# Patient Record
Sex: Female | Born: 1995 | Hispanic: Yes | Marital: Married | Smoking: Never smoker
Health system: Southern US, Community
[De-identification: ages and names within clinical notes are randomized; demographics above are authoritative.]

## PROBLEM LIST (undated history)

## (undated) ENCOUNTER — Inpatient Hospital Stay: Payer: Self-pay

## (undated) DIAGNOSIS — E559 Vitamin D deficiency, unspecified: Secondary | ICD-10-CM

## (undated) DIAGNOSIS — J45909 Unspecified asthma, uncomplicated: Secondary | ICD-10-CM

## (undated) HISTORY — PX: NO PAST SURGERIES: SHX2092

---
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
## 2017-10-23 NOTE — L&D Delivery Note (Signed)
Delivery Note  Date of delivery: 09/29/2018 Estimated Date of Delivery: 09/30/18 Patient's last menstrual period was 01/07/2018 (exact date). EGA: [id number]  First Stage: Labor onset: 09/28/2018 2200 Augmentation : AROM Analgesia Eliezer Lofts/Anesthesia intrapartum: IV pain medicine, epidural AROM at 1122 09/29/2018  Carloyn JaegerKelly Torres-Rivera presented to L&D with contractions. She was initially expectantly managed and later augmented with AROM. IV Stadol given and later epidural placed for pain relief.   Second Stage: Complete dilation at 1458 Onset of pushing at 1507 FHR second stage: category II Delivery at 1539 on 09/29/2018  She progressed to complete and had a spontaneous vaginal birth of a live female over an intact perineum. The fetal head was delivered in direct OA position with restitution to LOA. One loop of loose nuchal cord, reduced on the perineum. Anterior then posterior shoulders delivered spontaneously. Baby placed on mom's abdomen and attended to by transition RN. Cord clamped and cut after two minute delay by father of the baby.   Third Stage: Placenta delivered spontaneously intact with 3VC at 1554 Placenta disposition: routine disposal Uterine tone firm / bleeding scant IV pitocin given for hemorrhage prophylaxis  Left labial laceration identified, hemostatic Anesthesia for repair: n/a Repair: n/a Est. Blood Loss (mL): 400  Complications: none  Mom to postpartum.  Baby to Couplet care / Skin to Skin.  Newborn: Birth Weight: pending  Apgar Scores: 8, 9 Feeding planned: breast   Genia DelMargaret Holman Bonsignore, CNM 09/29/2018 4:05 PM

## 2018-01-27 ENCOUNTER — Encounter: Payer: Self-pay | Admitting: Emergency Medicine

## 2018-01-27 ENCOUNTER — Emergency Department
Admission: EM | Admit: 2018-01-27 | Discharge: 2018-01-27 | Disposition: A | Discharge status: Home / Self Care | Payer: Medicaid Other | Attending: Emergency Medicine | Admitting: Emergency Medicine

## 2018-01-27 DIAGNOSIS — Z3491 Encounter for supervision of normal pregnancy, unspecified, first trimester: Secondary | ICD-10-CM | POA: Insufficient documentation

## 2018-01-27 DIAGNOSIS — Z3A01 Less than 8 weeks gestation of pregnancy: Secondary | ICD-10-CM | POA: Diagnosis not present

## 2018-01-27 DIAGNOSIS — O99511 Diseases of the respiratory system complicating pregnancy, first trimester: Secondary | ICD-10-CM | POA: Diagnosis not present

## 2018-01-27 DIAGNOSIS — R0602 Shortness of breath: Secondary | ICD-10-CM | POA: Diagnosis not present

## 2018-01-27 DIAGNOSIS — R002 Palpitations: Secondary | ICD-10-CM | POA: Diagnosis not present

## 2018-01-27 DIAGNOSIS — O99411 Diseases of the circulatory system complicating pregnancy, first trimester: Secondary | ICD-10-CM | POA: Insufficient documentation

## 2018-01-27 DIAGNOSIS — Z79899 Other long term (current) drug therapy: Secondary | ICD-10-CM | POA: Insufficient documentation

## 2018-01-27 DIAGNOSIS — O24111 Pre-existing diabetes mellitus, type 2, in pregnancy, first trimester: Secondary | ICD-10-CM | POA: Insufficient documentation

## 2018-01-27 LAB — CBC
HCT: 41 % (ref 35.0–47.0)
Hemoglobin: 13.7 g/dL (ref 12.0–16.0)
MCH: 28.5 pg (ref 26.0–34.0)
MCHC: 33.4 g/dL (ref 32.0–36.0)
MCV: 85.3 fL (ref 80.0–100.0)
Platelets: 235 K/uL (ref 150–440)
RBC: 4.81 MIL/uL (ref 3.80–5.20)
RDW: 14.7 % — ABNORMAL HIGH (ref 11.5–14.5)
WBC: 10 K/uL (ref 3.6–11.0)

## 2018-01-27 LAB — BASIC METABOLIC PANEL WITH GFR
Anion gap: 8 (ref 5–15)
BUN: 11 mg/dL (ref 6–20)
CO2: 22 mmol/L (ref 22–32)
Calcium: 8.9 mg/dL (ref 8.9–10.3)
Chloride: 108 mmol/L (ref 101–111)
Creatinine, Ser: 0.64 mg/dL (ref 0.44–1.00)
GFR calc Af Amer: >60 mL/min
GFR calc non Af Amer: >60 mL/min
Glucose, Bld: 94 mg/dL (ref 65–99)
Potassium: 3.4 mmol/L — ABNORMAL LOW (ref 3.5–5.1)
Sodium: 138 mmol/L (ref 135–145)

## 2018-01-27 LAB — HCG, QUANTITATIVE, PREGNANCY: hCG, Beta Chain, Quant, S: 998 m[IU]/mL — ABNORMAL HIGH

## 2018-01-27 LAB — POCT PREGNANCY, URINE: Preg Test, Ur: POSITIVE — AB

## 2018-01-27 LAB — TROPONIN I: Troponin I: <0.03 ng/mL

## 2018-01-27 NOTE — Discharge Instructions (Addendum)
Please seek medical attention for any high fevers, chest pain, shortness of breath, change in behavior, persistent vomiting, bloody stool or any other new or concerning symptoms.  
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
## 2018-01-27 NOTE — ED Provider Notes (Signed)
[facility] Emergency Department Provider Note   ____________________________________________   I have reviewed the triage vital signs and the nursing notes.   HISTORY  Chief Complaint Shortness of Breath   History limited by: Not Limited   HPI Brianna Grimes is a 22 y.o. female who presents to the emergency department today because of concerns for shortness of breath and palpitations.  Patient states that the symptoms started after she got in an argument with her mother.  She feels like her heart will speed up and then slow down again.  She does have a history of panic attack when she was younger and states she tried slowing her breathing without any significant relief. The patient also recently found out she was pregnant and is concerned about the pregnancy. She states that this is really what is most concerning to her at this time. She thinks she is about [redacted] weeks pregnant. She denies any vaginal bleeding.     Per medical record review patient has a history of recently diagnosed pregnancy.  Past Medical History:  Diagnosis Date  . Diabetes mellitus without complication (HCC)   . Vitamin D deficiency     There are no active problems to display for this patient.   History reviewed. No pertinent surgical history.  Prior to Admission medications   Medication Sig Start Date End Date Taking? Authorizing Provider  amoxicillin (AMOXIL) 500 MG capsule Take 1 capsule (500 mg total) by mouth 3 (three) times daily. 01/09/16   Tommi Rumps, PA-C  ondansetron (ZOFRAN ODT) 4 MG disintegrating tablet Take 1 tablet (4 mg total) by mouth every 8 (eight) hours as needed for nausea or vomiting. 11/01/16   Darci Current, MD  traMADol (ULTRAM) 50 MG tablet Take 1 tablet (50 mg total) by mouth every 6 (six) hours as needed. 09/13/16   Rebecka Apley, MD    Allergies Patient has no known allergies.  No family history on file.  Social History Social  History   Tobacco Use  . Smoking status: Never Smoker  . Smokeless tobacco: Never Used  Substance Use Topics  . Alcohol use: No  . Drug use: Not Currently    Types: Marijuana    Review of Systems Constitutional: No fever/chills Eyes: No visual changes. ENT: No sore throat. Cardiovascular: Positive for palpitations Respiratory: Positive for shortness of breath. Gastrointestinal: No abdominal pain.  No nausea, no vomiting.  No diarrhea.   Genitourinary: Negative for dysuria. Musculoskeletal: Negative for back pain. Skin: Negative for rash. Neurological: Negative for headaches, focal weakness or numbness.  ____________________________________________   PHYSICAL EXAM:  VITAL SIGNS: ED Triage Vitals  Enc Vitals Group     BP 01/27/18 1943 128/88     Pulse Rate 01/27/18 1943 99     Resp 01/27/18 1943 18     Temp 01/27/18 1943 98.5 F (36.9 C)     Temp Source 01/27/18 1943 Oral     SpO2 01/27/18 1943 100 %     Weight 01/27/18 1944 275 lb (124.7 kg)     Height 01/27/18 1944 5\' 8"  (1.727 m)   Constitutional: Alert and oriented. Well appearing and in no distress. Eyes: Conjunctivae are normal.  ENT   Head: Normocephalic and atraumatic.   Nose: No congestion/rhinnorhea.   Mouth/Throat: Mucous membranes are moist.   Neck: No stridor. Hematological/Lymphatic/Immunilogical: No cervical lymphadenopathy. Cardiovascular: Normal rate, regular rhythm.  No murmurs, rubs, or gallops. Respiratory: Normal respiratory effort without tachypnea nor retractions. Breath sounds  are clear and equal bilaterally. No wheezes/rales/rhonchi. Gastrointestinal: Soft and non tender. No rebound. No guarding.  Genitourinary: Deferred Musculoskeletal: Normal range of motion in all extremities. No lower extremity edema. Neurologic:  Normal speech and language. No gross focal neurologic deficits are appreciated.  Skin:  Skin is warm, dry and intact. No rash noted. Psychiatric: Mood and affect  are normal. Speech and behavior are normal. Patient exhibits appropriate insight and judgment.  ____________________________________________    LABS (pertinent positives/negatives)  Upreg negative BMP k 3.4, na 138, cr 0.64 CBC wbc 10.0, hgb 13.7, plt 235 Trop <0.03 bhcg 998  ____________________________________________   EKG  I, Phineas SemenGraydon Randal Yepiz, attending physician, personally viewed and interpreted this EKG  EKG Time: 1934 Rate: 88 Rhythm: normal sinus rhythm Axis: normal Intervals: qtc 418 QRS: narrow ST changes: no st elevation Impression: normal ekg   ____________________________________________    RADIOLOGY  None  ____________________________________________   PROCEDURES  Procedures  ____________________________________________   INITIAL IMPRESSION / ASSESSMENT AND PLAN / ED COURSE  Pertinent labs & imaging results that were available during my care of the patient were reviewed by me and considered in my medical decision making (see chart for details).  Patient presents to the emergency department today with primary concern for shortness of breath palpitations after getting an argument with her mom.  Patient also has concerns because she is early in pregnancy is concerned she might of harm the baby. Bhcg 998. Had a discussion with the patient about how it is still very early and we would not expect to see any signs of pregnancy on US at this time. Patient's blood work without any concerning findings for palpitations and shortness of breath. Patient did state she felt better after our conversation and I do wonder if a lot of the patient's symptoms related to her concern for her pregnancy. Will plan on discharging. Discussed importance of PCP follow up and ob/ gyn follow up. Discussed ectopic return precautions.   ____________________________________________   FINAL CLINICAL IMPRESSION(S) / ED DIAGNOSES  Final diagnoses:  Shortness of breath  Less than  [redacted] weeks gestation of pregnancy  Palpitations     Note: This dictation was prepared with Dragon dictation. Any transcriptional errors that result from this process are unintentional     Phineas SemenGoodman, Kaidan Spengler, MD 01/27/18 2243

## 2018-02-10 ENCOUNTER — Emergency Department
Admission: EM | Admit: 2018-02-10 | Discharge: 2018-02-10 | Discharge status: Against Medical Advice | Payer: Medicaid Other

## 2018-03-05 LAB — OB RESULTS CONSOLE HEPATITIS B SURFACE ANTIGEN
HEP B S AG: NEGATIVE
Hepatitis B Surface Ag: NEGATIVE

## 2018-03-05 LAB — OB RESULTS CONSOLE VARICELLA ZOSTER ANTIBODY, IGG: Varicella: NON-IMMUNE/NOT IMMUNE

## 2018-03-06 ENCOUNTER — Other Ambulatory Visit: Payer: Self-pay | Admitting: Certified Nurse Midwife

## 2018-03-06 DIAGNOSIS — Z369 Encounter for antenatal screening, unspecified: Secondary | ICD-10-CM

## 2018-03-28 ENCOUNTER — Ambulatory Visit: Payer: Medicaid Other

## 2018-08-19 ENCOUNTER — Inpatient Hospital Stay: Admission: RE | Admit: 2018-08-19 | Payer: Self-pay | Source: Ambulatory Visit

## 2018-08-26 ENCOUNTER — Inpatient Hospital Stay: Admission: RE | Admit: 2018-08-26 | Payer: Self-pay | Source: Ambulatory Visit

## 2018-09-04 LAB — OB RESULTS CONSOLE HIV ANTIBODY (ROUTINE TESTING): HIV: NONREACTIVE

## 2018-09-04 LAB — OB RESULTS CONSOLE GBS: GBS: NEGATIVE

## 2018-09-04 LAB — OB RESULTS CONSOLE GC/CHLAMYDIA
Chlamydia: NEGATIVE
Gonorrhea: NEGATIVE

## 2018-09-09 ENCOUNTER — Inpatient Hospital Stay: Admission: RE | Admit: 2018-09-09 | Payer: Self-pay | Source: Ambulatory Visit

## 2018-09-11 ENCOUNTER — Encounter
Admission: RE | Admit: 2018-09-11 | Discharge: 2018-09-11 | Disposition: A | Discharge status: Home / Self Care | Payer: Medicaid Other | Source: Ambulatory Visit | Attending: Anesthesiology | Admitting: Anesthesiology

## 2018-09-11 NOTE — Consult Note (Signed)
[facility] Anesthesia Consultation  Brianna Grimes ZOX:[id number] DOB: Nov 06, 1995 DOA: 09/11/2018 [place], [facility]   [facility] Date of consultation: 09/11/18 Reason for consultation: Obesity during pregnancy  CHIEF COMPLAINT:  Obesity during pregnancy  HISTORY OF PRESENT ILLNESS: Brianna Grimes  is a 22 y.o. female with a known history of obesity during pregnancy. Denies hx of cardiac disease. Denies hx of asthma. This is her first pregnancy. Currently planning for natural child birth but open to neuraxial analgesia.   PAST MEDICAL HISTORY:   Past Medical History:  Diagnosis Date  . Diabetes mellitus without complication (HCC)   . Vitamin D deficiency     PAST SURGICAL HISTORY: No past surgical history on file.  SOCIAL HISTORY:  Social History   Tobacco Use  . Smoking status: Never Smoker  . Smokeless tobacco: Never Used  Substance Use Topics  . Alcohol use: No    FAMILY HISTORY: No family history on file.  DRUG ALLERGIES: No Known Allergies  REVIEW OF SYSTEMS:   RESPIRATORY: No cough, shortness of breath, wheezing.  CARDIOVASCULAR: No chest pain, orthopnea, edema.  HEMATOLOGY: No anemia, easy bruising or bleeding SKIN: No rash or lesion. NEUROLOGIC: No tingling, numbness, weakness.  PSYCHIATRY: No anxiety or depression.   MEDICATIONS AT HOME:  Prior to Admission medications   Medication Sig Start Date End Date Taking? Authorizing Provider  amoxicillin (AMOXIL) 500 MG capsule Take 1 capsule (500 mg total) by mouth 3 (three) times daily. 01/09/16   Tommi RumpsSummers, Rhonda L, PA-C  ondansetron (ZOFRAN ODT) 4 MG disintegrating tablet Take 1 tablet (4 mg total) by mouth every 8 (eight) hours as needed for nausea or vomiting. 11/01/16   Darci CurrentBrown, [place] N, MD  traMADol (ULTRAM) 50 MG tablet Take 1 tablet (50 mg total) by mouth every 6 (six) hours as needed. 09/13/16   Rebecka ApleyWebster, Allison P, MD       PHYSICAL EXAMINATION:   VITAL SIGNS: Last menstrual period 01/07/2018.  GENERAL:  22 y.o.-year-old patient no acute distress.  HEENT: Head atraumatic, normocephalic. Oropharynx and nasopharynx clear. MP 2, TM distance >3 cm, normal mouth opening. LUNGS: Normal breath sounds bilaterally, no wheezing, rales,rhonchi. No use of accessory muscles of respiration.  CARDIOVASCULAR: S1, S2 normal. No murmurs, rubs, or gallops.  EXTREMITIES: No pedal edema, cyanosis, or clubbing.  NEUROLOGIC: normal gait PSYCHIATRIC: The patient is alert and oriented x 3.  SKIN: No obvious rash, lesion, or ulcer.    IMPRESSION AND PLAN:   Brianna Grimes  is a 22 y.o. female presenting with obesity during pregnancy. BMI is currently 49 at [redacted] weeks gestation.   We discussed analgesic options during labor including epidural analgesia. Discussed that in obesity there can be increased difficulty with epidural placement or even failure of successful epidural. We also discussed that even after successful epidural placement there is increased risk of catheter migration out of the epidural space that would require catheter replacement. Discussed use of epidural vs spinal vs GA if need for cesarean delivery. Discussed increased risk of difficult intubation during pregnancy should an emergency cesarean delivery be required.   [facility].

## 2018-09-12 ENCOUNTER — Observation Stay
Admission: EM | Admit: 2018-09-12 | Discharge: 2018-09-12 | Disposition: A | Discharge status: Home / Self Care | Payer: Medicaid Other | Attending: Obstetrics and Gynecology | Admitting: Obstetrics and Gynecology

## 2018-09-12 ENCOUNTER — Other Ambulatory Visit: Payer: Self-pay

## 2018-09-12 DIAGNOSIS — O26893 Other specified pregnancy related conditions, third trimester: Principal | ICD-10-CM | POA: Insufficient documentation

## 2018-09-12 DIAGNOSIS — R110 Nausea: Secondary | ICD-10-CM | POA: Diagnosis not present

## 2018-09-12 DIAGNOSIS — Z3A37 37 weeks gestation of pregnancy: Secondary | ICD-10-CM | POA: Diagnosis not present

## 2018-09-12 DIAGNOSIS — O99213 Obesity complicating pregnancy, third trimester: Secondary | ICD-10-CM | POA: Diagnosis not present

## 2018-09-12 DIAGNOSIS — O24913 Unspecified diabetes mellitus in pregnancy, third trimester: Secondary | ICD-10-CM | POA: Insufficient documentation

## 2018-09-12 DIAGNOSIS — O219 Vomiting of pregnancy, unspecified: Secondary | ICD-10-CM | POA: Diagnosis present

## 2018-09-12 LAB — COMPREHENSIVE METABOLIC PANEL
ALBUMIN: 2.8 g/dL — AB (ref 3.5–5.0)
ALT: 14 U/L (ref 0–44)
ANION GAP: 9 (ref 5–15)
AST: 16 U/L (ref 15–41)
Alkaline Phosphatase: 158 U/L — ABNORMAL HIGH (ref 38–126)
BUN: 9 mg/dL (ref 6–20)
CHLORIDE: 105 mmol/L (ref 98–111)
CO2: 23 mmol/L (ref 22–32)
Calcium: 8.8 mg/dL — ABNORMAL LOW (ref 8.9–10.3)
Creatinine, Ser: 0.38 mg/dL — ABNORMAL LOW (ref 0.44–1.00)
GFR calc Af Amer: >60 mL/min (ref >60)
GFR calc non Af Amer: >60 mL/min (ref >60)
GLUCOSE: 88 mg/dL (ref 70–99)
POTASSIUM: 3.7 mmol/L (ref 3.5–5.1)
Sodium: 137 mmol/L (ref 135–145)
Total Bilirubin: 0.4 mg/dL (ref 0.3–1.2)
Total Protein: 6.7 g/dL (ref 6.5–8.1)

## 2018-09-12 LAB — PROTEIN / CREATININE RATIO, URINE
CREATININE, URINE: 150 mg/dL
Protein Creatinine Ratio: 0.2 mg/mg{Cre} — ABNORMAL HIGH (ref 0.00–0.15)
Total Protein, Urine: 30 mg/dL

## 2018-09-12 LAB — CBC
HEMATOCRIT: 35.8 % — AB (ref 36.0–46.0)
HEMOGLOBIN: 11.3 g/dL — AB (ref 12.0–15.0)
MCH: 26.8 pg (ref 26.0–34.0)
MCHC: 31.6 g/dL (ref 30.0–36.0)
MCV: 85 fL (ref 80.0–100.0)
Platelets: 210 10*3/uL (ref 150–400)
RBC: 4.21 MIL/uL (ref 3.87–5.11)
RDW: 15.2 % (ref 11.5–15.5)
WBC: 9.3 10*3/uL (ref 4.0–10.5)
nRBC: 0 % (ref 0.0–0.2)

## 2018-09-12 LAB — URINALYSIS, ROUTINE W REFLEX MICROSCOPIC
Bacteria, UA: NONE SEEN
Bilirubin Urine: NEGATIVE
GLUCOSE, UA: NEGATIVE mg/dL
Ketones, ur: NEGATIVE mg/dL
NITRITE: NEGATIVE
PROTEIN: 30 mg/dL — AB
Specific Gravity, Urine: 1.019 (ref 1.005–1.030)
pH: 6 (ref 5.0–8.0)

## 2018-09-12 LAB — AMYLASE: Amylase: 47 U/L (ref 28–100)

## 2018-09-12 LAB — LIPASE, BLOOD: Lipase: 24 U/L (ref 11–51)

## 2018-09-12 MED ORDER — ONDANSETRON 4 MG PO TBDP
4.0000 mg | ORAL_TABLET | Freq: Once | ORAL | Status: AC
  Administered 2018-09-12: 4 mg via ORAL
  Filled 2018-09-12: qty 1

## 2018-09-12 MED ORDER — ONDANSETRON 4 MG PO TBDP: 4.0000 mg | ORAL_TABLET | Freq: Three times a day (TID) | ORAL | 0 refills | Status: DC | PRN

## 2018-09-12 NOTE — OB Triage Note (Signed)
Pt presents c/o nausea and lower abdominal cramps that radiate around to her back that started about 3-4 days ago. Pt states the cramps come and go and rates them 4/10. Pt has not had any recent intercourse. Denies any bleeding or LOF. Reports positive fetal movement. Vitals WNL. Will continue to monitor.

## 2018-09-12 NOTE — Discharge Summary (Signed)
Brianna Grimes is Grimes 22 y.o. female. She is at [redacted]w[redacted]d gestation. Patient's last menstrual period was 01/07/2018 (exact date). Estimated Date of Delivery: 09/30/18  Prenatal care site: [facility]   Current pregnancy complicated by: 1. Obesity, BMI 41.5 pre-preg, current BMI 48; has seen anesthesia on 11/21 2. Jehovah's Witness, will NOT accept blood/blood products 3. Rubella non-immune, Varicella non-immune  Chief complaint: Nausea since Sunday  Location: nausea Onset/timing: onset about 5days ago Duration: constant Quality: n/Grimes Severity: n/Grimes Aggravating or alleviating conditions: able to eat and drink without vomiting.  Associated signs/symptoms: also having occasional lower abdominal and back pains that come and go. Feeling of vaginal pressure at times.   Context: missed OB appt yesterday at [facility]  S: Resting comfortably. no CTX, no VB.no LOF,  Active fetal movement. Denies: HA, visual changes, SOB, or RUQ/epigastric pain  Maternal Medical History:   Past Medical History:  Diagnosis Date  . Diabetes mellitus without complication (HCC)   . Vitamin D deficiency   Prediabetes- stopped Metformin prior to pregnancy.   History reviewed. No pertinent surgical history.  No Known Allergies  Prior to Admission medications   Medication Sig Start Date End Date Taking? Authorizing Provider  Prenatal Vit-Fe Fumarate-FA (MULTIVITAMIN-PRENATAL) 27-0.8 MG TABS tablet Take 1 tablet by mouth daily at 12 noon.   Yes [provider]  amoxicillin (AMOXIL) 500 MG capsule Take 1 capsule (500 mg total) by mouth 3 (three) times daily. Patient not taking: Reported on 09/12/2018 01/09/16   Bridget Hartshorn L, PA-C  ondansetron (ZOFRAN ODT) 4 MG disintegrating tablet Take 1 tablet (4 mg total) by mouth every 8 (eight) hours as needed for nausea or vomiting. Patient not taking: Reported on 09/12/2018 11/01/16   Darci Current, MD  traMADol (ULTRAM) 50 MG tablet Take 1 tablet (50 mg  total) by mouth every 6 (six) hours as needed. Patient not taking: Reported on 09/12/2018 09/13/16   Rebecka Apley, MD      Social History: She  reports that she has never smoked. She has never used smokeless tobacco. She reports that she has current or past drug history. Drug: Marijuana. She reports that she does not drink alcohol.  Family History: maternal great-aunt with breast Ca, HTN in brother and father, mother and father with bipolar d/o.   Review of Systems: Grimes full review of systems was performed and negative except as noted in the HPI.     O:  BP 111/68 (BP Location: Right Arm)   Pulse 81   Temp 97.6 F (36.4 C) (Oral)   Resp 16   Ht 5\' 7"  (1.702 m)   Wt (!) 138.8 kg   LMP 01/07/2018 (Exact Date)   SpO2 99%   BMI 47.93 kg/m  Results for orders placed or performed during the hospital encounter of 09/12/18 (from the past 48 hour(s))  Protein / creatinine ratio, urine   Collection Time: 09/12/18  8:53 AM  Result Value Ref Range   Creatinine, Urine 150 mg/dL   Total Protein, Urine 30 mg/dL   Protein Creatinine Ratio 0.20 (H) 0.00 - 0.15 mg/mg[Cre]  Urinalysis, Routine w reflex microscopic   Collection Time: 09/12/18  8:53 AM  Result Value Ref Range   Color, Urine YELLOW (Grimes) YELLOW   APPearance CLOUDY (Grimes) CLEAR   Specific Gravity, Urine 1.019 1.005 - 1.030   pH 6.0 5.0 - 8.0   Glucose, UA NEGATIVE NEGATIVE mg/dL   Hgb urine dipstick SMALL (Grimes) NEGATIVE   Bilirubin Urine NEGATIVE  NEGATIVE   Ketones, ur NEGATIVE NEGATIVE mg/dL   Protein, ur 30 (Grimes) NEGATIVE mg/dL   Nitrite NEGATIVE NEGATIVE   Leukocytes, UA MODERATE (Grimes) NEGATIVE   RBC / HPF 0-5 0 - 5 RBC/hpf   WBC, UA 11-20 0 - 5 WBC/hpf   Bacteria, UA NONE SEEN NONE SEEN   Squamous Epithelial / LPF 21-50 0 - 5   Mucus PRESENT   Comprehensive metabolic panel   Collection Time: 09/12/18  8:57 AM  Result Value Ref Range   Sodium 137 135 - 145 mmol/L   Potassium 3.7 3.5 - 5.1 mmol/L   Chloride 105 98 - 111  mmol/L   CO2 23 22 - 32 mmol/L   Glucose, Bld 88 70 - 99 mg/dL   BUN 9 6 - 20 mg/dL   Creatinine, Ser 6.570.38 (L) 0.44 - 1.00 mg/dL   Calcium 8.8 (L) 8.9 - 10.3 mg/dL   Total Protein 6.7 6.5 - 8.1 g/dL   Albumin 2.8 (L) 3.5 - 5.0 g/dL   AST 16 15 - 41 U/L   ALT 14 0 - 44 U/L   Alkaline Phosphatase 158 (H) 38 - 126 U/L   Total Bilirubin 0.4 0.3 - 1.2 mg/dL   GFR calc non Af Amer >60 >60 mL/min   GFR calc Af Amer >60 >60 mL/min   Anion gap 9 5 - 15  CBC on admission   Collection Time: 09/12/18  8:57 AM  Result Value Ref Range   WBC 9.3 4.0 - 10.5 K/uL   RBC 4.21 3.87 - 5.11 MIL/uL   Hemoglobin 11.3 (L) 12.0 - 15.0 g/dL   HCT 84.635.8 (L) 96.236.0 - 95.246.0 %   MCV 85.0 80.0 - 100.0 fL   MCH 26.8 26.0 - 34.0 pg   MCHC 31.6 30.0 - 36.0 g/dL   RDW 84.115.2 32.411.5 - 40.115.5 %   Platelets 210 150 - 400 K/uL   nRBC 0.0 0.0 - 0.2 %  Lipase, blood   Collection Time: 09/12/18  8:57 AM  Result Value Ref Range   Lipase 24 11 - 51 U/L  Amylase   Collection Time: 09/12/18  8:57 AM  Result Value Ref Range   Amylase 47 28 - 100 U/L     Constitutional: NAD, AAOx3, obese  HE/ENT: extraocular movements grossly intact, moist mucous membranes CV: RRR PULM: nl respiratory effort, CTABL     Abd: gravid, non-tender, non-distended, soft      Ext: Non-tender, Nonedematous   Psych: mood appropriate, speech normal Pelvic: deferred  Fetal  monitoring: NST reactive Cat I Appropriate for GA Baseline: 130bpm Variability: moderate Accelerations:  present x >2 Decelerations absent Time 20mins    Grimes/P: 22 y.o. 4337w3d here for antenatal surveillance for nausea in pregnancy at 37wks  Principle Diagnosis:  Nausea in pregnancy, 37weeks   Labor: not present.   Fetal Wellbeing: Reassuring Cat 1 tracing with Reactive NST   CMP, CBC, amylase and lipase with no evidence of HELLP or  pancreatitis. UA contaminated catch, no e/o infection.   Comfort measures and normal changes in late pregnancy discussed, rx for prn  zofran to pharmacy. Discussed improved hydration.   D/c home stable, precautions reviewed, follow-up as scheduled.    Brianna Grimes , CNM 09/12/2018  10:12 AM

## 2018-09-12 NOTE — Discharge Summary (Signed)
RN reviewed discharge instructions with patient. Gave patient opportunity for questions. All questions answered appropriately. Pt discharged home with her mother.

## 2018-09-19 ENCOUNTER — Other Ambulatory Visit: Payer: Self-pay

## 2018-09-19 ENCOUNTER — Observation Stay
Admission: EM | Admit: 2018-09-19 | Discharge: 2018-09-19 | Disposition: A | Discharge status: Home / Self Care | Payer: Medicaid Other | Attending: Obstetrics and Gynecology | Admitting: Obstetrics and Gynecology

## 2018-09-19 DIAGNOSIS — Z3490 Encounter for supervision of normal pregnancy, unspecified, unspecified trimester: Secondary | ICD-10-CM

## 2018-09-19 DIAGNOSIS — J45909 Unspecified asthma, uncomplicated: Secondary | ICD-10-CM | POA: Insufficient documentation

## 2018-09-19 DIAGNOSIS — O479 False labor, unspecified: Secondary | ICD-10-CM | POA: Diagnosis present

## 2018-09-19 DIAGNOSIS — O471 False labor at or after 37 completed weeks of gestation: Secondary | ICD-10-CM | POA: Diagnosis not present

## 2018-09-19 DIAGNOSIS — O99283 Endocrine, nutritional and metabolic diseases complicating pregnancy, third trimester: Secondary | ICD-10-CM | POA: Insufficient documentation

## 2018-09-19 DIAGNOSIS — E559 Vitamin D deficiency, unspecified: Secondary | ICD-10-CM | POA: Diagnosis not present

## 2018-09-19 DIAGNOSIS — Z3A38 38 weeks gestation of pregnancy: Secondary | ICD-10-CM | POA: Insufficient documentation

## 2018-09-19 DIAGNOSIS — O99513 Diseases of the respiratory system complicating pregnancy, third trimester: Secondary | ICD-10-CM | POA: Diagnosis not present

## 2018-09-19 DIAGNOSIS — Z3480 Encounter for supervision of other normal pregnancy, unspecified trimester: Secondary | ICD-10-CM

## 2018-09-19 DIAGNOSIS — Z79899 Other long term (current) drug therapy: Secondary | ICD-10-CM | POA: Insufficient documentation

## 2018-09-19 DIAGNOSIS — Z7951 Long term (current) use of inhaled steroids: Secondary | ICD-10-CM | POA: Insufficient documentation

## 2018-09-19 DIAGNOSIS — O1213 Gestational proteinuria, third trimester: Secondary | ICD-10-CM | POA: Insufficient documentation

## 2018-09-19 HISTORY — DX: Unspecified asthma, uncomplicated: J45.909

## 2018-09-19 LAB — PROTEIN / CREATININE RATIO, URINE
CREATININE, URINE: 182 mg/dL
Protein Creatinine Ratio: 0.44 mg/mg{Cre} — ABNORMAL HIGH (ref 0.00–0.15)
TOTAL PROTEIN, URINE: 80 mg/dL

## 2018-09-19 NOTE — OB Triage Note (Signed)
Pt presents from ED with complaints of contractions every 3 minutes for the last hour. States her cervix was 3cm dilated at her last appointment 2 weeks ago. Denies bleeding, decreased fetal movement, N/V/D. Endorses positive fetal movement and intercourse in the last 24 hours.

## 2018-09-19 NOTE — Progress Notes (Signed)
Patient ID: Brianna Grimes, female   DOB: 1996-09-01, [age] y.o.   MRN: [id number]  Brianna Grimes is a 22 y.o. female. She is at 603w3d gestation. Patient's last menstrual period was 01/07/2018 (exact date). Estimated Date of Delivery: 09/30/18  c/o uterine ctx  [facility]   Chief complaint:uterine contractions   Location: Onset/timing: Duration: Quality:  Severity: Aggravating or alleviating conditions: Associated signs/symptoms: Context:  S: Resting comfortably. no CTX, no VB.no LOF,  Active fetal movement.   Maternal Medical History:   Past Medical History:  Diagnosis Date  . Asthma   . Vitamin D deficiency     Past Surgical History:  Procedure Laterality Date  . NO PAST SURGERIES      No Known Allergies  Prior to Admission medications   Medication Sig Start Date End Date Taking? Authorizing Provider  albuterol (PROVENTIL) (5 MG/ML) 0.5% nebulizer solution Take 2.5 mg by nebulization every 6 (six) hours as needed for wheezing or shortness of breath.   Yes [provider]  ondansetron (ZOFRAN-ODT) 4 MG disintegrating tablet Take 1 tablet (4 mg total) by mouth every 8 (eight) hours as needed for nausea or vomiting. 09/12/18  Yes McVey, Prudencio Pairebecca A, CNM  Prenatal Vit-Fe Fumarate-FA (MULTIVITAMIN-PRENATAL) 27-0.8 MG TABS tablet Take 1 tablet by mouth daily at 12 noon.    [provider]     Social History: She  reports that she has never smoked. She has never used smokeless tobacco. She reports that she has current or past drug history. Drug: Marijuana. She reports that she does not drink alcohol.  Family History: family history is not on file.  no history of gyn cancers  Review of Systems: A full review of systems was performed and negative except as noted in the HPI.   Review of Systems: A full review of systems was performed and negative except as noted in the HPI.   Eyes: no vision change  Ears: left ear pain   Oropharynx: no sore throat  Pulmonary . No shortness of breath , no hemoptysis Cardiovascular: no chest pain , no irregular heart beat  Gastrointestinal:no blood in stool . No diarrhea, no constipation Uro gynecologic: no dysuria , no pelvic pain Neurologic : no seizure , no migraines    Musculoskeletal: no muscular weakness  O:  BP 135/80   Pulse 91   Temp 98.3 F (36.8 C) (Oral)   Resp 20   Ht 5\' 8"  (1.727 m)   Wt (!) 137.4 kg   LMP 01/07/2018 (Exact Date)   BMI 46.07 kg/m  No results found for this or any previous visit (from the past 48 hour(s)).   Constitutional: NAD, AAOx3  HE/ENT: extraocular movements grossly intact, moist mucous membranes CV: RRR PULM: nl respiratory effort, CTABL     Abd: gravid, non-tender, non-distended, soft      Ext: Non-tender, Nonedmeatous   Psych: mood appropriate, speech normal Pelvic 3 cm /60 blt by RN NST:  Baseline:140  Variability: moderate Accelerations present x >2 Decelerations absent. Reactive  Time 20mins  Results for orders placed or performed during the hospital encounter of 09/19/18 (from the past 24 hour(s))  Protein / creatinine ratio, urine     Status: Abnormal   Collection Time: 09/19/18 10:20 PM  Result Value Ref Range   Creatinine, Urine 182 mg/dL   Total Protein, Urine 80 mg/dL   Protein Creatinine Ratio 0.44 (H) 0.00 - 0.15 mg/mg[Cre]    Assessment: 22 y.o. 943w3d here for antenatal  surveillance during pregnancy.  Principle diagnosis: latent labor . Proteinuria , not meeting preeclampsia criteria  Plan:  D/c home . Precautions given     ----- Jennell Corner  MD Attending Obstetrician and Gynecologist [facility], Department of OB/GYN [facility]

## 2018-09-20 NOTE — Discharge Summary (Signed)
Brianna Grimes is a 22 y.o. female. She is at [redacted]w[redacted]d gestation. Patient's last menstrual period was 01/07/2018 (exact date). Estimated Date of Delivery: 09/30/18  c/o uterine ctx  [facility] care site: [facility]   Chief complaint:uterine contractions   Location: Onset/timing: Duration: Quality:  Severity: Aggravating or alleviating conditions: Associated signs/symptoms: Context:  S: Resting comfortably. noCTX, no VB.no LOF, Active fetal movement.   Maternal Medical History:   Past Medical History:  Diagnosis Date  . Asthma   . Vitamin D deficiency          Past Surgical History:  Procedure Laterality Date  . NO PAST SURGERIES      No Known Allergies         Prior to Admission medications   Medication Sig Start Date End Date Taking? Authorizing Provider  albuterol (PROVENTIL) (5 MG/ML) 0.5% nebulizer solution Take 2.5 mg by nebulization every 6 (six) hours as needed for wheezing or shortness of breath.   Yes [provider]  ondansetron (ZOFRAN-ODT) 4 MG disintegrating tablet Take 1 tablet (4 mg total) by mouth every 8 (eight) hours as needed for nausea or vomiting. 09/12/18  Yes McVey, Prudencio Pair, CNM  Prenatal Vit-Fe Fumarate-FA (MULTIVITAMIN-PRENATAL) 27-0.8 MG TABS tablet Take 1 tablet by mouth daily at 12 noon.    [provider]     Social History: She  reports that she has never smoked. She has never used smokeless tobacco. She reports that she has current or past drug history. Drug: Marijuana. She reports that she does not drink alcohol.  Family History: family history is not on file.  no history of gyn cancers  Review of Systems: A full review of systems was performed and negative except as noted in the HPI.   Review of Systems: A full review of systems was performed and negativeexcept as noted in the HPI.  Eyes: no vision change  Ears: left ear pain  Oropharynx: no sore throat  Pulmonary . No  shortness of breath , no hemoptysis Cardiovascular: no chest pain , no irregular heart beat  Gastrointestinal:no blood in stool . No diarrhea, no constipation Uro gynecologic: no dysuria , no pelvic pain Neurologic : no seizure , no migraines  Musculoskeletal: no muscular weakness  O:  Objective   BP 135/80   Pulse 91   Temp 98.3 F (36.8 C) (Oral)   Resp 20   Ht 5\' 8"  (1.727 m)   Wt (!) 137.4 kg   LMP 01/07/2018 (Exact Date)   BMI 46.07 kg/m  No results found for this or any previous visit (from the past 48 hour(s)).  Constitutional: NAD, AAOx3  HE/ENT: extraocular movements grossly intact, moist mucous membranes CV: RRR PULM: nl respiratory effort, CTABL                                         Abd: gravid, non-tender, non-distended, soft                                                  Ext: Non-tender, Nonedmeatous                     Psych: mood appropriate, speech normal Pelvic 3 cm /60 blt by RN NST:  Baseline:140  Variability: moderate Accelerations present x >2 Decelerations absent. Reactive  Time 20mins  LabResultsLast24Hours       Results for orders placed or performed during the hospital encounter of 09/19/18 (from the past 24 hour(s))  Protein / creatinine ratio, urine     Status: Abnormal   Collection Time: 09/19/18 10:20 PM  Result Value Ref Range   Creatinine, Urine 182 mg/dL   Total Protein, Urine 80 mg/dL   Protein Creatinine Ratio 0.44 (H) 0.00 - 0.15 mg/mg[Cre]      Assessment: 22 y.o. 7160w3d here for antenatal surveillance during pregnancy.  Principle diagnosis: latent labor . Proteinuria , not meeting preeclampsia criteria  Plan:  D/c home . Precautions given     ----- Jennell Cornerhomas   MD Attending Obstetrician and Gynecologist [facility], Department of OB/GYN [facility]         Electronically signed by , Ihor Austinhomas J, MD at 09/20/2018 12:16 AM

## 2018-09-26 ENCOUNTER — Other Ambulatory Visit: Payer: Self-pay | Admitting: Obstetrics and Gynecology

## 2018-09-29 ENCOUNTER — Inpatient Hospital Stay
Admission: EM | Admit: 2018-09-29 | Discharge: 2018-10-01 | DRG: 807 | Disposition: A | Discharge status: Home / Self Care | Payer: Medicaid Other | Attending: Certified Nurse Midwife | Admitting: Certified Nurse Midwife

## 2018-09-29 ENCOUNTER — Other Ambulatory Visit: Payer: Self-pay

## 2018-09-29 ENCOUNTER — Encounter: Payer: Self-pay | Admitting: Obstetrics and Gynecology

## 2018-09-29 ENCOUNTER — Inpatient Hospital Stay: Payer: Medicaid Other | Admitting: Anesthesiology

## 2018-09-29 DIAGNOSIS — J45909 Unspecified asthma, uncomplicated: Secondary | ICD-10-CM | POA: Diagnosis present

## 2018-09-29 DIAGNOSIS — Z3483 Encounter for supervision of other normal pregnancy, third trimester: Secondary | ICD-10-CM | POA: Diagnosis present

## 2018-09-29 DIAGNOSIS — Z3A39 39 weeks gestation of pregnancy: Secondary | ICD-10-CM | POA: Diagnosis not present

## 2018-09-29 DIAGNOSIS — O99214 Obesity complicating childbirth: Secondary | ICD-10-CM | POA: Diagnosis present

## 2018-09-29 DIAGNOSIS — O9952 Diseases of the respiratory system complicating childbirth: Secondary | ICD-10-CM | POA: Diagnosis present

## 2018-09-29 LAB — CBC
HCT: 37.9 % (ref 36.0–46.0)
Hemoglobin: 11.8 g/dL — ABNORMAL LOW (ref 12.0–15.0)
MCH: 26 pg (ref 26.0–34.0)
MCHC: 31.1 g/dL (ref 30.0–36.0)
MCV: 83.7 fL (ref 80.0–100.0)
NRBC: 0 % (ref 0.0–0.2)
PLATELETS: 208 10*3/uL (ref 150–400)
RBC: 4.53 MIL/uL (ref 3.87–5.11)
RDW: 16 % — ABNORMAL HIGH (ref 11.5–15.5)
WBC: 12.5 10*3/uL — ABNORMAL HIGH (ref 4.0–10.5)

## 2018-09-29 LAB — TYPE AND SCREEN
ABO/RH(D): A POS
Antibody Screen: NEGATIVE

## 2018-09-29 MED ORDER — SODIUM CHLORIDE 0.9 % IV SOLN
INTRAVENOUS | Status: DC | PRN
  Administered 2018-09-29 (×3): 5 mL via EPIDURAL

## 2018-09-29 MED ORDER — SIMETHICONE 80 MG PO CHEW: 160.0000 mg | CHEWABLE_TABLET | ORAL | Status: DC | PRN

## 2018-09-29 MED ORDER — ACETAMINOPHEN 325 MG PO TABS: 650.0000 mg | ORAL_TABLET | ORAL | Status: DC | PRN

## 2018-09-29 MED ORDER — COCONUT OIL OIL
1.0000 "application " | TOPICAL_OIL | Status: DC | PRN
  Administered 2018-09-30: 1 via TOPICAL
  Filled 2018-09-29: qty 120

## 2018-09-29 MED ORDER — LIDOCAINE HCL (PF) 1 % IJ SOLN: 30.0000 mL | INTRAMUSCULAR | Status: DC | PRN

## 2018-09-29 MED ORDER — MEASLES, MUMPS & RUBELLA VAC IJ SOLR
0.5000 mL | Freq: Once | INTRAMUSCULAR | Status: AC
  Administered 2018-10-01: 0.5 mL via SUBCUTANEOUS
  Filled 2018-09-29 (×2): qty 0.5

## 2018-09-29 MED ORDER — PRENATAL MULTIVITAMIN CH
1.0000 | ORAL_TABLET | Freq: Every day | ORAL | Status: DC
  Administered 2018-09-30 – 2018-10-01 (×2): 1 via ORAL
  Filled 2018-09-29 (×2): qty 1

## 2018-09-29 MED ORDER — AMMONIA AROMATIC IN INHA
RESPIRATORY_TRACT | Status: AC
  Filled 2018-09-29: qty 10

## 2018-09-29 MED ORDER — VARICELLA VIRUS VACCINE LIVE 1350 PFU/0.5ML IJ SUSR
0.5000 mL | Freq: Once | INTRAMUSCULAR | Status: AC
  Administered 2018-10-01: 0.5 mL via SUBCUTANEOUS
  Filled 2018-09-29 (×2): qty 0.5

## 2018-09-29 MED ORDER — ONDANSETRON HCL 4 MG PO TABS: 4.0000 mg | ORAL_TABLET | ORAL | Status: DC | PRN

## 2018-09-29 MED ORDER — DIPHENHYDRAMINE HCL 50 MG/ML IJ SOLN: 12.5000 mg | INTRAMUSCULAR | Status: DC | PRN

## 2018-09-29 MED ORDER — FENTANYL 2.5 MCG/ML W/ROPIVACAINE 0.15% IN NS 100 ML EPIDURAL (ARMC)
12.0000 mL/h | EPIDURAL | Status: DC
  Administered 2018-09-29: 12 mL/h via EPIDURAL

## 2018-09-29 MED ORDER — MISOPROSTOL 200 MCG PO TABS
ORAL_TABLET | ORAL | Status: AC
  Filled 2018-09-29: qty 4

## 2018-09-29 MED ORDER — OXYTOCIN 10 UNIT/ML IJ SOLN
INTRAMUSCULAR | Status: AC
  Filled 2018-09-29: qty 2

## 2018-09-29 MED ORDER — LIDOCAINE HCL (PF) 1 % IJ SOLN
INTRAMUSCULAR | Status: DC | PRN
  Administered 2018-09-29: 1 mL

## 2018-09-29 MED ORDER — TERBUTALINE SULFATE 1 MG/ML IJ SOLN: 0.2500 mg | Freq: Once | INTRAMUSCULAR | Status: DC | PRN

## 2018-09-29 MED ORDER — OXYTOCIN BOLUS FROM INFUSION
500.0000 mL | Freq: Once | INTRAVENOUS | Status: AC
  Administered 2018-09-29: 500 mL via INTRAVENOUS

## 2018-09-29 MED ORDER — LACTATED RINGERS IV SOLN: 500.0000 mL | Freq: Once | INTRAVENOUS | Status: DC

## 2018-09-29 MED ORDER — BENZOCAINE-MENTHOL 20-0.5 % EX AERO
1.0000 "application " | INHALATION_SPRAY | CUTANEOUS | Status: DC | PRN
  Administered 2018-09-30: 1 via TOPICAL
  Filled 2018-09-29: qty 56

## 2018-09-29 MED ORDER — WITCH HAZEL-GLYCERIN EX PADS: 1.0000 "application " | MEDICATED_PAD | CUTANEOUS | Status: DC | PRN

## 2018-09-29 MED ORDER — SOD CITRATE-CITRIC ACID 500-334 MG/5ML PO SOLN: 30.0000 mL | ORAL | Status: DC | PRN

## 2018-09-29 MED ORDER — PHENYLEPHRINE 40 MCG/ML (10ML) SYRINGE FOR IV PUSH (FOR BLOOD PRESSURE SUPPORT): 80.0000 ug | PREFILLED_SYRINGE | INTRAVENOUS | Status: DC | PRN

## 2018-09-29 MED ORDER — LACTATED RINGERS IV SOLN
INTRAVENOUS | Status: DC
  Administered 2018-09-29 (×3): via INTRAVENOUS

## 2018-09-29 MED ORDER — FENTANYL 2.5 MCG/ML W/ROPIVACAINE 0.15% IN NS 100 ML EPIDURAL (ARMC)
EPIDURAL | Status: AC
  Filled 2018-09-29: qty 100

## 2018-09-29 MED ORDER — IBUPROFEN 600 MG PO TABS
600.0000 mg | ORAL_TABLET | Freq: Four times a day (QID) | ORAL | Status: DC
  Administered 2018-09-30 – 2018-10-01 (×8): 600 mg via ORAL
  Filled 2018-09-29 (×8): qty 1

## 2018-09-29 MED ORDER — DIBUCAINE 1 % RE OINT: 1.0000 "application " | TOPICAL_OINTMENT | RECTAL | Status: DC | PRN

## 2018-09-29 MED ORDER — EPHEDRINE 5 MG/ML INJ: 10.0000 mg | INTRAVENOUS | Status: DC | PRN

## 2018-09-29 MED ORDER — ONDANSETRON HCL 4 MG/2ML IJ SOLN: 4.0000 mg | INTRAMUSCULAR | Status: DC | PRN

## 2018-09-29 MED ORDER — OXYTOCIN 40 UNITS IN LACTATED RINGERS INFUSION - SIMPLE MED
1.0000 m[IU]/min | INTRAVENOUS | Status: DC
  Administered 2018-09-29: 2 m[IU]/min via INTRAVENOUS

## 2018-09-29 MED ORDER — LIDOCAINE HCL (PF) 1 % IJ SOLN
INTRAMUSCULAR | Status: AC
  Filled 2018-09-29: qty 30

## 2018-09-29 MED ORDER — OXYTOCIN 40 UNITS IN LACTATED RINGERS INFUSION - SIMPLE MED
2.5000 [IU]/h | INTRAVENOUS | Status: DC
  Filled 2018-09-29 (×2): qty 1000

## 2018-09-29 MED ORDER — ONDANSETRON HCL 4 MG/2ML IJ SOLN
4.0000 mg | Freq: Four times a day (QID) | INTRAMUSCULAR | Status: DC | PRN
  Administered 2018-09-29: 4 mg via INTRAVENOUS
  Filled 2018-09-29: qty 2

## 2018-09-29 MED ORDER — DIPHENHYDRAMINE HCL 25 MG PO CAPS: 25.0000 mg | ORAL_CAPSULE | Freq: Four times a day (QID) | ORAL | Status: DC | PRN

## 2018-09-29 MED ORDER — BUTORPHANOL TARTRATE 2 MG/ML IJ SOLN
1.0000 mg | INTRAMUSCULAR | Status: DC | PRN
  Administered 2018-09-29 (×5): 1 mg via INTRAVENOUS
  Filled 2018-09-29 (×5): qty 1

## 2018-09-29 MED ORDER — LACTATED RINGERS IV SOLN: 500.0000 mL | INTRAVENOUS | Status: DC | PRN

## 2018-09-29 MED ORDER — SENNOSIDES-DOCUSATE SODIUM 8.6-50 MG PO TABS
2.0000 | ORAL_TABLET | ORAL | Status: DC
  Administered 2018-09-30 – 2018-10-01 (×2): 2 via ORAL
  Filled 2018-09-29 (×3): qty 2

## 2018-09-29 NOTE — Discharge Instructions (Signed)

## 2018-09-29 NOTE — Progress Notes (Signed)
Labor Progress Note  Carloyn JaegerKelly Torres-Rivera is a 22 y.o. G1P0 at 4052w6d by ultrasound admitted for active labor.  Subjective:  Feeling very comfortable with epidural in place. Feeling intermittent rectal pressure.   Objective: BP 136/86   Pulse 95   Temp 98 F (36.7 C) (Oral)   Resp 15   Ht 5\' 8"  (1.727 m)   Wt (!) 138.8 kg   LMP 01/07/2018 (Exact Date)   SpO2 97%   BMI 46.53 kg/m   Fetal Assessment: FHT:  FHR: 125 bpm, variability: moderate,  accelerations:  Present,  decelerations:  Present variable Category/reactivity:  Category II UC:   regular, every 2-6 minutes per RN SVE:    Dilation: 10cm  Effacement: 100%  Station:  0  Consistency: soft  Position: anterior  Membrane status: AROM 1122 09/29/2018 Amniotic color: clear  Labs: Lab Results  Component Value Date   WBC 12.5 (H) 09/29/2018   HGB 11.8 (L) 09/29/2018   HCT 37.9 09/29/2018   MCV 83.7 09/29/2018   PLT 208 09/29/2018    Assessment / Plan: Spontaneous labor, progressing normally  Labor: Progressing normally, s/p AROM for clear fluid; complete/complete/zero station Fetal Wellbeing:  Category I Pain Control:  Epidural I/D:  n/a Anticipated MOD:  NSVD   Patient very comfortable with epidural in place, unsure if she is feeling her contractions at all. Per RN, difficult to palpate contractions and difficult to trace with toco. In order to facilitate pushing, IUPC placed to synchronize pushing efforts with contractions. Pitocin started at low-dose as some contractions are quite mild.   Genia DelMargaret Jaylaa Gallion, CNM 09/29/2018, 3:12 PM

## 2018-09-29 NOTE — Anesthesia Procedure Notes (Signed)
Epidural Patient location during procedure: OB Start time: 09/29/2018 12:37 PM End time: 09/29/2018 12:50 PM  Staffing Anesthesiologist: Jovita GammaFitzgerald, Coila Wardell L, MD Performed: anesthesiologist   Preanesthetic Checklist Completed: patient identified, site marked, surgical consent, pre-op evaluation, timeout performed, IV checked, risks and benefits discussed and monitors and equipment checked  Epidural Patient position: sitting Prep: ChloraPrep Patient monitoring: heart rate, continuous pulse ox and blood pressure Approach: midline Location: L4-L5 Injection technique: LOR saline  Needle:  Needle type: Tuohy  Needle gauge: 18 G Needle length: 9 cm and 9 Needle insertion depth: 9 cm Catheter type: closed end flexible Catheter size: 20 Guage Catheter at skin depth: 14 cm Test dose: negative and Other  Assessment Events: blood not aspirated, injection not painful, no injection resistance, negative IV test and no paresthesia  Additional Notes   Patient tolerated the insertion well without complications.Reason for block:procedure for pain

## 2018-09-29 NOTE — Progress Notes (Signed)
Labor Progress Note  Brianna JaegerKelly Grimes is a 22 y.o. G1P0 at 2747w6d by ultrasound admitted for active labor.  Subjective:  Contractions are starting to get more intense.   Objective: BP (!) 99/47 (BP Location: Left Arm)   Pulse 87   Temp 98.1 F (36.7 C) (Oral)   Resp 16   Ht 5\' 8"  (1.727 m)   Wt (!) 138.8 kg   LMP 01/07/2018 (Exact Date)   BMI 46.53 kg/m   Fetal Assessment: FHT:  FHR: 135 bpm, variability: moderate,  accelerations:  Present,  decelerations:  Absent Category/reactivity:  Category I UC:   regular, every 2-3 minutes per patient SVE:    Dilation: 5-6cm  Effacement: 100%  Station:  -1  Consistency: soft  Position: anterior  Membrane status: intact Amniotic color: n/a  Labs: Lab Results  Component Value Date   WBC 12.5 (H) 09/29/2018   HGB 11.8 (L) 09/29/2018   HCT 37.9 09/29/2018   MCV 83.7 09/29/2018   PLT 208 09/29/2018    Assessment / Plan: Spontaneous labor, progressing normally  Labor: Progressing normally Fetal Wellbeing:  Category I Pain Control:  Maternal pain control as desired: IVPM, nitrous, regional anesthesia I/D:  n/a Anticipated MOD:  NSVD  Brianna Grimes, CNM 09/29/2018, 9:22 AM

## 2018-09-29 NOTE — Lactation Note (Signed)
This note was copied from a baby's chart. Lactation Consultation Note  Patient Name: Brianna Carloyn JaegerKelly Torres-Rivera [id number]'s Date: 09/29/2018 Reason for consult: Primapara;Follow-up assessment;Term;Other (Comment)(Mom asking for bottle of formula)  Mom requesting formula stating, "Baird LyonsCasey has been on the breast continually and is not satisfied.  She just keeps crying no matter what I do.  She must still be hungry."  Mom agreed to let me help her breast feed again to make sure she is on correctly.  [name] latched with minimal assistance and sucked for 6 to 7 minutes before falling asleep.  Snuggled her in Halo and placed in the crib.  Shortly after laying her in the crib, she started crying again, but settled down easily.  Mom asking for pacifier in case she keeps fussing.  Discouraged pacifier or supplementation explaining risks to breast feeding.  Discussed making sure she fed well before introducing pacifier.  Mom insisted on having one just in case.  Pacifier given to mom after consent signed. Maternal Data Formula Feeding for Exclusion: No Has patient been taught Hand Expression?: Yes Does the patient have breastfeeding experience prior to this delivery?: No  Feeding Feeding Type: Breast Fed  LATCH Score Latch: Repeated attempts needed to sustain latch, nipple held in mouth throughout feeding, stimulation needed to elicit sucking reflex.  Audible Swallowing: A few with stimulation  Type of Nipple: Everted at rest and after stimulation  Comfort (Breast/Nipple): Soft / non-tender  Hold (Positioning): Assistance needed to correctly position infant at breast and maintain latch.  LATCH Score: 7  Interventions Interventions: Position options;Assisted with latch;Reverse pressure;Skin to skin;Breast compression;Breast massage;Adjust position;Support pillows  Lactation Tools Discussed/Used WIC Program: Yes   Consult Status Consult Status: Follow-up Date: 09/29/18 Follow-up type: Call as  needed    Louis MeckelWilliams, Chancy Claros Kay 09/29/2018, 8:07 PM

## 2018-09-29 NOTE — Discharge Summary (Signed)
Obstetric Discharge Summary   Patient Name: Brianna Grimes DOB: 03-05-1996 MRN: [id number]  Date of Admission: 09/29/2018 Date of Delivery: 09/29/2018 Delivered by: Genia Del, CNM Date of Discharge: 10/01/2018  Primary OB: [facility]  WUJ:[id number]'W last menstrual period was 01/07/2018 (exact date). EDC Estimated Date of Delivery: 09/30/18 Gestational Age at Delivery: [redacted]w[redacted]d   Antepartum complications:  1. Prepregnancy BMI 41.5 2. Jehovah's Witness, will NOT accept blood/blood products 3. Rubella non-immune 4. Varicella non-immune  Admitting Diagnosis: labor  Secondary Diagnoses: Patient Active Problem List   Diagnosis Date Noted  . Labor and delivery indication for care or intervention 09/29/2018  . Uterine contractions during pregnancy 09/19/2018  . Pregnancy 09/19/2018  . Nausea and vomiting during pregnancy 09/12/2018    Augmentation: AROM Complications: None Intrapartum complications/course: Brianna Grimes presented to L&D with contractions. She was initially expectantly managed and later augmented with AROM. IV Stadol given and later epidural placed for pain relief. She progressed to complete and had a spontaneous vaginal birth of a live female over an intact perineum. The fetal head was delivered in direct OA position with restitution to LOA. One loop of loose nuchal cord, reduced on the perineum. Anterior then posterior shoulders delivered spontaneously. Baby placed on mom's abdomen and attended to by transition RN. Cord clamped and cut after two minute delay by father of the baby. Placenta delivered spontaneously intact with 3VC at 1554. Left labial laceration identified, hemostatic.  Delivery Type: spontaneous vaginal delivery Anesthesia: epidural Placenta: spontaneous Laceration: left labial, hemostatic Episiotomy: none  Newborn Data: Live born female "Baird Lyons" Birth Weight:  8lb 4.3oz 3750g APGAR: 8, 9  Newborn Delivery    Birth date/time:  09/29/2018 15:49:00 Delivery type:  Vaginal, Spontaneous     Postpartum Course  Patient had an uncomplicated postpartum course. By time of discharge on PPD#2, her pain was controlled on oral pain medications; she had appropriate lochia and was ambulating, voiding without difficulty and tolerating regular diet.  She was deemed stable for discharge to home.       Labs: CBC Latest Ref Rng & Units 09/30/2018 09/29/2018 09/12/2018  WBC 4.0 - 10.5 K/uL 11.8(H) 12.5(H) 9.3  Hemoglobin 12.0 - 15.0 g/dL 10.8(L) 11.8(L) 11.3(L)  Hematocrit 36.0 - 46.0 % 34.8(L) 37.9 35.8(L)  Platelets 150 - 400 K/uL 195 208 210   A POS  Physical exam:  BP (!) 111/52   Pulse 98   Temp 98 F (36.7 C) (Oral)   Resp 20   Ht 5\' 8"  (1.727 m)   Wt (!) 138.8 kg   LMP 01/07/2018 (Exact Date)   SpO2 97%   BMI 46.53 kg/m   General: alert and no distress Pulm: normal respiratory effort Lochia: appropriate Abdomen: soft, NT Uterine Fundus: firm, below umbilicus Extremities: No evidence of DVT seen on physical exam. No lower extremity edema.   Disposition: stable, discharge to home Baby Feeding: breastmilk & formula Baby Disposition: home with mom  Contraception: TBD  Prenatal Labs:  Blood type/Rh A+  Antibody screen neg  Rubella Non-immune  Varicella Non-immune  RPR NR  HBsAg Neg  HIV NR  GC neg  Chlamydia neg  Genetic screening declined  1 hour GTT 115  3 hour GTT n/a  GBS negative    Rh Immune globulin given: n/a Rubella vaccine given: to be offered prior to discharge Varicella vaccine given: to be offered prior to discharge Tdap vaccine given in AP or PP setting: 07/10/2018 Flu vaccine given in AP or PP setting: 08/07/2018  Plan:  Brianna JaegerKelly Grimes was discharged to home in good condition. [facility] with delivery provider in 6 weeks  Discharge Instructions: Per After Visit Summary. Activity: Advance as tolerated. Pelvic rest  for 6 weeks.   Diet: Regular Discharge Medications: Allergies as of 10/01/2018   No Known Allergies     Medication List    STOP taking these medications   ondansetron 4 MG disintegrating tablet Commonly known as:  ZOFRAN-ODT     TAKE these medications   acetaminophen 325 MG tablet Commonly known as:  TYLENOL Take 2 tablets (650 mg total) by mouth every 4 (four) hours as needed for mild pain, moderate pain or headache.   albuterol (5 MG/ML) 0.5% nebulizer solution Commonly known as:  PROVENTIL Take 2.5 mg by nebulization every 6 (six) hours as needed for wheezing or shortness of breath.   ibuprofen 600 MG tablet Commonly known as:  ADVIL,MOTRIN Take 1 tablet (600 mg total) by mouth every 6 (six) hours as needed for mild pain, moderate pain or cramping.   multivitamin-prenatal 27-0.8 MG Tabs tablet Take 1 tablet by mouth daily at 12 noon.      Outpatient follow up:  Follow-up Information    Genia DelHaviland, Catarina Huntley, CNM. Schedule an appointment as soon as possible for a visit in 6 week(s).   Specialty:  Certified Nurse Midwife Why:  For routine postpartum visit.  Contact information: [address] [place] [id number] [phone]            Signed: Genia DelMargaret Luree Palla, CNM 10/01/2018 8:54 AM

## 2018-09-29 NOTE — Anesthesia Preprocedure Evaluation (Signed)
Anesthesia Evaluation  Patient identified by MRN, date of birth, ID band Patient awake    Reviewed: Allergy & Precautions, H&P , NPO status , Patient's Chart, lab work & pertinent test results  Airway Mallampati: III  TM Distance: >3 FB Neck ROM: full    Dental  (+) Teeth Intact   Pulmonary asthma ,           Cardiovascular Exercise Tolerance: Good negative cardio ROS       Neuro/Psych    GI/Hepatic negative GI ROS,   Endo/Other  Morbid obesity  Renal/GU   negative genitourinary   Musculoskeletal   Abdominal   Peds  Hematology negative hematology ROS (+)   Anesthesia Other Findings Past Medical History: No date: Asthma No date: Vitamin D deficiency  Past Surgical History: No date: NO PAST SURGERIES  BMI    Body Mass Index:  46.53 kg/m      Reproductive/Obstetrics (+) Pregnancy                             Anesthesia Physical Anesthesia Plan  ASA: II  Anesthesia Plan: Epidural   Post-op Pain Management:    Induction:   PONV Risk Score and Plan:   Airway Management Planned:   Additional Equipment:   Intra-op Plan:   Post-operative Plan:   Informed Consent: I have reviewed the patients History and Physical, chart, labs and discussed the procedure including the risks, benefits and alternatives for the proposed anesthesia with the patient or authorized representative who has indicated his/her understanding and acceptance.     Plan Discussed with: Anesthesiologist  Anesthesia Plan Comments:         Anesthesia Quick Evaluation

## 2018-09-29 NOTE — H&P (Signed)
OB History & Physical   History of Present Illness:  Chief Complaint: contractions  HPI:  Carloyn JaegerKelly Torres-Rivera is a 22 y.o. G1P0 female at 6017w6d dated by 1252w4d ultrasound.  She presents to L&D with contractions.   She reports:  -active fetal movement -no leakage of fluid -no vaginal bleeding -onset of contractions at 2200 09/28/18 currently every 3 minutes  Pregnancy Issues: 1. Prepregnancy BMI 41.5 2. Jehovah's Witness, will NOT accept blood/blood products 3. Rubella non-immune 4. Varicella non-immune   Maternal Medical History:   Past Medical History:  Diagnosis Date  . Asthma   . Vitamin D deficiency     Past Surgical History:  Procedure Laterality Date  . NO PAST SURGERIES      No Known Allergies  Prior to Admission medications   Medication Sig Start Date End Date Taking? Authorizing Provider  ondansetron (ZOFRAN-ODT) 4 MG disintegrating tablet Take 1 tablet (4 mg total) by mouth every 8 (eight) hours as needed for nausea or vomiting. 09/12/18  Yes McVey, Prudencio Pairebecca A, CNM  Prenatal Vit-Fe Fumarate-FA (MULTIVITAMIN-PRENATAL) 27-0.8 MG TABS tablet Take 1 tablet by mouth daily at 12 noon.   Yes [provider]  albuterol (PROVENTIL) (5 MG/ML) 0.5% nebulizer solution Take 2.5 mg by nebulization every 6 (six) hours as needed for wheezing or shortness of breath.    [provider]     [facility]   Social History: She  reports that she has never smoked. She has never used smokeless tobacco. She reports that she has current or past drug history. Drug: Marijuana. She reports that she does not drink alcohol.  Family History: family history includes Bipolar disorder in her father, mother, and paternal grandmother; Breast cancer in her maternal aunt and maternal aunt; Diabetes in her maternal aunt and mother; Hypertension in her brother and father.   Review of Systems: A full review of systems was performed and negative  except as noted in the HPI.    Physical Exam:  Vital Signs: BP 125/76 (BP Location: Left Arm)   Pulse 92   Temp 97.8 F (36.6 C) (Oral)   Resp 16   Ht 5\' 8"  (1.727 m)   Wt (!) 138.8 kg   LMP 01/07/2018 (Exact Date)   BMI 46.53 kg/m   General:   alert, cooperative, appears stated age and no distress  Skin:  normal and no rash or abnormalities  Neurologic:    Alert & oriented x 3  Lungs:   clear to auscultation bilaterally  Heart:   regular rate and rhythm, S1, S2 normal, no murmur, click, rub or gallop  Abdomen:  soft, non-tender; bowel sounds normal; no masses,  no organomegaly  Pelvis:  Exam deferred.  FHT: 143 BPM  Presentations: cephalic  Cervix:  per RN Jobie QuakerKate Veal at [phone]   Dilation: 4.5   Effacement: 100%   Station:  -1   Consistency: medium   Position: middle  Extremities: : non-tender, symmetric, no edema bilaterally.    EFW: 7lb   Pertinent Results:  Prenatal Labs: Blood type/Rh A+  Antibody screen neg  Rubella Non-immune  Varicella Non-immune  RPR NR  HBsAg Neg  HIV NR  GC neg  Chlamydia neg  Genetic screening declined  1 hour GTT 115  3 hour GTT n/a  GBS negative   FHT: FHR: 140 bpm, variability: moderate,  accelerations:  Present,  decelerations:  Absent Category/reactivity:  Category I TOCO: regular, every 2-3 minutes  Assessment:  Carloyn JaegerKelly Torres-Rivera  is a 22 y.o. G1P0 female at [redacted]w[redacted]d with labor.   Plan:  1. Admit to Labor & Delivery; consents reviewed and obtained  2. Fetal Well being  - Fetal Tracing: category I - GBS negative - Presentation: cephalic confirmed by Leopold's   3. Routine OB: - Prenatal labs reviewed, as above - Rh positive - CBC & T&S on admit - Clear fluids, IVF  4. Monitoring of Labor -  Contractions to be monitored with external toco in place -  Pelvis unproven -  Plan for expectant management -  Plan for continuous fetal monitoring  -  Maternal pain control as desired: IVPM, nitrous, regional anesthesia -   Anticipate vaginal delivery  5. Post Partum Planning: - Infant feeding: breastfeeding - Contraception: TBD  Genia Del, CNM 09/29/2018 4:11 AM ----- Genia Del Certified Nurse Midwife [facility], Department of OB/GYN [facility]

## 2018-09-29 NOTE — OB Triage Note (Signed)
Pt presents to L&D with c/o contractions, increasing in intensity and frequency over the last few hours. Pt denies vaginal bleeding, decreased fetal movement, or leaking of fluid. Toco and EFM applied and explained. All questions answered. Will monitor closely.

## 2018-09-29 NOTE — Lactation Note (Signed)
This note was copied from a baby's chart. Lactation Consultation Note  Patient Name: Brianna Carloyn JaegerKelly Torres-Rivera ZOXWR'UToday's Date: 09/29/2018 Reason for consult: Initial assessment;Primapara;Term;Difficult latch When went in to assist with first breast feeding, mom was already attempting but could not get her to open her mouth.  Mom has small wide spaced breasts.  Demonstrated hand expression and got a couple of drops of colostrum which was rubbed on [name]'s lips.  She barely licked.  Gentle pressure applied to her chin.  She finally opened her mouth to achieve latch.  Massaged mom's breast and stimulated Baird Lyonsasey for her to begin sucking.  At first the suck was weak, but eventually she started strong rhythmic sucking with occasional swallows.  She continued with strong rhythmic sucking and pauses for 5 to 8 minutes before coming off the breast on her own.  After a few minutes skin to skin with mom, she started fussing.  Moved her to left breast trying football hold.  She calmed down, but never resumed productive sucking.  Left her skin to skin with mom attempting a couple of more times to get deep latch.  Mom wanted Baird LyonsCasey swaddled for father of baby to hold.  Discussed supply and demand, normal course of lactation and routine newborn feeding patterns.  Encouraged mom to call [facility] for questions, concerns or assistance.    Maternal Data Formula Feeding for Exclusion: No Has patient been taught Hand Expression?: Yes Does the patient have breastfeeding experience prior to this delivery?: No  Feeding Feeding Type: Breast Fed  LATCH Score Latch: Repeated attempts needed to sustain latch, nipple held in mouth throughout feeding, stimulation needed to elicit sucking reflex.  Audible Swallowing: A few with stimulation  Type of Nipple: Everted at rest and after stimulation  Comfort (Breast/Nipple): Soft / non-tender  Hold (Positioning): Assistance needed to correctly position infant at breast and maintain  latch.  LATCH Score: 7  Interventions Interventions: Breast feeding basics reviewed;Position options;Assisted with latch;Reverse pressure;Skin to skin;Breast compression;Breast massage;Adjust position;Support pillows  Lactation Tools Discussed/Used WIC Program: Yes   Consult Status Consult Status: Follow-up Date: 09/29/18 Follow-up type: Call as needed    Louis MeckelWilliams, Windle Huebert Kay 09/29/2018, 5:37 PM

## 2018-09-29 NOTE — Progress Notes (Signed)
Labor Progress Note  Brianna Grimes is a 22 y.o. G1P0 at 782w6d by ultrasound admitted for active labor.  Subjective:  Consistent contractions. Desires AROM.   Objective: BP 129/80   Pulse 87   Temp 98.2 F (36.8 C) (Oral)   Resp 15   Ht 5\' 8"  (1.727 m)   Wt (!) 138.8 kg   LMP 01/07/2018 (Exact Date)   BMI 46.53 kg/m   Fetal Assessment: FHT:  FHR: 130 bpm, variability: moderate,  accelerations:  Present,  decelerations:  Absent Category/reactivity:  Category I UC:   regular, every 2-3 minutes per patient SVE:    Dilation: 6cm  Effacement: 100%  Station:  -1  Consistency: soft  Position: anterior  Membrane status: AROM 1122 09/29/2018 Amniotic color: clear  Labs: Lab Results  Component Value Date   WBC 12.5 (H) 09/29/2018   HGB 11.8 (L) 09/29/2018   HCT 37.9 09/29/2018   MCV 83.7 09/29/2018   PLT 208 09/29/2018    Assessment / Plan: Spontaneous labor, progressing normally  Labor: Progressing normally, s/p AROM for clear fluid Fetal Wellbeing:  Category I Pain Control:  Maternal pain control as desired: IVPM, nitrous, regional anesthesia I/D:  n/a Anticipated MOD:  NSVD  Brianna Grimes, CNM 09/29/2018, 11:24 AM

## 2018-09-30 LAB — CBC
HCT: 34.8 % — ABNORMAL LOW (ref 36.0–46.0)
Hemoglobin: 10.8 g/dL — ABNORMAL LOW (ref 12.0–15.0)
MCH: 26.1 pg (ref 26.0–34.0)
MCHC: 31 g/dL (ref 30.0–36.0)
MCV: 84.1 fL (ref 80.0–100.0)
PLATELETS: 195 10*3/uL (ref 150–400)
RBC: 4.14 MIL/uL (ref 3.87–5.11)
RDW: 16.3 % — ABNORMAL HIGH (ref 11.5–15.5)
WBC: 11.8 10*3/uL — ABNORMAL HIGH (ref 4.0–10.5)
nRBC: 0 % (ref 0.0–0.2)

## 2018-09-30 NOTE — Progress Notes (Signed)
Post Partum Day 1 Subjective:   Objective: Blood pressure 101/79, pulse 83, temperature (!) 97.5 F (36.4 C), temperature source Oral, resp. rate 14, height 5\' 8"  (1.727 m), weight (!) 138.8 kg, last menstrual period 01/07/2018, SpO2 99 %.  Physical Exam:  General: A,A& O x3 HEART:S1S2, RRR, NO M/R/G LUNGS;CTA BILAT, NO W/R/R Lochia: mod, no clots Uterine Fundus: U-!, FF Incision: Perineum intact DVT Evaluation: Neg Homans  Recent Labs    09/29/18 0427 09/30/18 0435  HGB 11.8* 10.8*  HCT 37.9 34.8*    Assessment/Plan: A:1. Normal PPD#1 stable P: DC in am  LOS: 1 day   Sharee Pimplearon W Cassandr Cederberg 09/30/2018, 8:40 AM

## 2018-09-30 NOTE — Lactation Note (Signed)
This note was copied from a baby's chart. Lactation Consultation Note  Patient Name: Brianna Carloyn JaegerKelly Torres-Rivera ZOXWR'UToday's Date: 09/30/2018 Reason for consult: Follow-up assessment;Mother's request   Maternal Data Has patient been taught Hand Expression?: Yes  Feeding Feeding Type: Breast Fed  LATCH Score                   Interventions    Lactation Tools Discussed/Used     Consult Status Consult Status: PRN Educated parents on the physiology of breastfeeding and establishing breast milk supply. Spoke with them about clusterfeeding and transitional stool. Parents know about breastfeeding resources and [facility] has comfort gels and RN will give coconut oil prior to d/c. [place] and will get pump and support there.    Burnadette PeterJaniya M Esperanza Madrazo 09/30/2018, 9:56 AM

## 2018-09-30 NOTE — Anesthesia Postprocedure Evaluation (Signed)
Anesthesia Post Note  Patient: Brianna Grimes  Procedure(s) Performed: AN AD HOC LABOR EPIDURAL  Patient location during evaluation: Mother Baby Anesthesia Type: Epidural Level of consciousness: awake, awake and alert and oriented Pain management: pain level controlled Vital Signs Assessment: post-procedure vital signs reviewed and stable Respiratory status: spontaneous breathing, nonlabored ventilation and respiratory function stable Cardiovascular status: blood pressure returned to baseline and stable Postop Assessment: no headache and no backache Anesthetic complications: no     Last Vitals:  Vitals:   09/30/18 0408 09/30/18 0635  BP: (!) 111/43 116/68  Pulse: 90 91  Resp: 20   Temp: 36.9 C   SpO2: 100% 99%    Last Pain:  Vitals:   09/30/18 0600  TempSrc:   PainSc: 0-No pain                 Chiropodisttephanie Sanaa Zilberman

## 2018-10-01 ENCOUNTER — Encounter: Payer: Self-pay | Admitting: Certified Nurse Midwife

## 2018-10-01 LAB — RPR: RPR Ser Ql: NONREACTIVE

## 2018-10-01 MED ORDER — IBUPROFEN 600 MG PO TABS: 600.0000 mg | ORAL_TABLET | Freq: Four times a day (QID) | ORAL | 0 refills | Status: DC | PRN

## 2018-10-01 MED ORDER — ACETAMINOPHEN 325 MG PO TABS: 650.0000 mg | ORAL_TABLET | ORAL | 0 refills | Status: AC | PRN

## 2018-10-01 NOTE — Progress Notes (Signed)
All discharge instructions given to pt on previous shift; pt to be "discharged" but will be staying in room 336 due to baby having to stay as a "peds pt"

## 2018-10-01 NOTE — Plan of Care (Signed)
Vs stable; up ad lib; tolerating regular diet; taking motrin for pain control; breastfeeding baby and has asked for no assistance this shift with breastfeeding

## 2018-10-01 NOTE — Lactation Note (Signed)
This note was copied from a baby's chart. Lactation Consultation Note  Patient Name: Brianna Grimes [id number]'s Date: 10/01/2018   I worked with this Mom this morning as she said Breastfeeds "did not go well" last night and baby under photo therapy for bili 12.1. She had already bottle fed formula when I saw her then, so we focused on practicing hand expressing and proper pumping. I encouraged her to call me if she planned to breastfeed again, so I could assess and adjust feeding plan. We discussed that because of modest diaper counts and increased bili to offer at least 10-15 ml after any BF attempt (not pumping or expressing more than drops of colostrum now). No known risk factors for poor supply other than poor feeds/breast stim, etc.   This afternoon, I checked in on her to learn she had only bottle fed small amounts of formula to baby. She said she pumped 4 times and still not getting more than drops. The nurse told me there may be a chance of baby being discharged tonight if bili good enough. She also said she had been encouraging baby to get at least 15 ml per feed.   I reviewed pump options for home use.  I first learned from peer counselor Ismari at [facility], [facility]. Ismari called this Mom to see if she wanted [facility], and mom declined saying she " will be ok [facility] get [facility]".  After reviewing all pump options including [employer], mom opted to try hand pump. I showed her how to use and clean it. She has BF booklet with all info on storage and cleaning as well. she said "it feels better than electric pump" and then she started getting more drops of colostrum out. Since I had not been able to help her with breastfeeding today, I encouraged her  [facility]. She told me she [facility] She has [facility] contact info     Maternal Data    Feeding Feeding Type: Bottle Fed - Formula Nipple Type: Slow - flow  LATCH Score                   Interventions    Lactation Tools Discussed/Used     Consult Status      Brianna Grimes 10/01/2018, 4:47 PM

## 2018-10-01 NOTE — Progress Notes (Signed)
Pt discharged with infant.  Discharge instructions, prescriptions and follow up appointment given to and reviewed with pt. Pt verbalized understanding.  

## 2018-12-20 DIAGNOSIS — F339 Major depressive disorder, recurrent, unspecified: Secondary | ICD-10-CM | POA: Insufficient documentation

## 2018-12-20 DIAGNOSIS — F419 Anxiety disorder, unspecified: Secondary | ICD-10-CM | POA: Insufficient documentation

## 2019-10-24 NOTE — L&D Delivery Note (Signed)
Date of delivery: 07/29/2020 Estimated Date of Delivery: 07/27/20 Patient's last menstrual period was 10/21/2019. EGA: [redacted]w[redacted]d  Delivery Note At 12:55 AM a viable female was delivered via Vaginal, Spontaneous (Presentation:  Cephalic, Direct OA ).  APGAR: 8, 9; weight 9 lb 8 oz (4310 g).   Placenta status: Spontaneous, Intact.  Cord: 3 vessels with the following complications: None.  Cord pH: not obtained per Neonatology. Placenta:  Disc intact, eccentric insertion, normal appearance, long cord.  Anesthesia: Epidural Episiotomy: None Lacerations: None Suture Repair: n/a Est. Blood Loss (mL): 530cc  Mom presented to L&D with PROM, augmented with pitocin.  epidual placed. Progressed to complete, second stage: 2 contractions.  delivery of fetal head with restitution to ROT.   Anterior then posterior shoulders delivered without difficulty.  Baby placed on mom's chest, and attended to by peds. Cord was then clamped and cut when pulseless.  Placenta spontaneously delivered, intact.   IV pitocin given for hemorrhage prophylaxis.  Minimal blood loss at time of delivery (10cc). Shortly after delivery, several clots were expressed, cytotec was placed rectally.  Total loss 2hrs after delivery was 530cc.  The cord had a normal pulse while intact.  Following cutting the cord, the neonate developed sustained tachycardia of 250bpm.  Neonatology was summoned to the room, baby was given blow-by O2, deep suctioning, and ice cooling without improvement.  Baby was taken to the NICU for adenosine. This resolved the tachycardia.  She will be kept for observation.     Mom to postpartum.  Baby to NICU.  Nasier Thumm C Juandedios Dudash 07/29/2020, 3:14 AM

## 2020-02-10 ENCOUNTER — Other Ambulatory Visit: Payer: Self-pay

## 2020-02-10 ENCOUNTER — Emergency Department
Admission: EM | Admit: 2020-02-10 | Discharge: 2020-02-10 | Disposition: A | Discharge status: Home / Self Care | Payer: Medicaid Other | Attending: Emergency Medicine | Admitting: Emergency Medicine

## 2020-02-10 ENCOUNTER — Emergency Department: Payer: Medicaid Other

## 2020-02-10 DIAGNOSIS — R0602 Shortness of breath: Secondary | ICD-10-CM | POA: Diagnosis not present

## 2020-02-10 DIAGNOSIS — Z3A16 16 weeks gestation of pregnancy: Secondary | ICD-10-CM | POA: Insufficient documentation

## 2020-02-10 DIAGNOSIS — Z79899 Other long term (current) drug therapy: Secondary | ICD-10-CM | POA: Insufficient documentation

## 2020-02-10 DIAGNOSIS — O99512 Diseases of the respiratory system complicating pregnancy, second trimester: Secondary | ICD-10-CM | POA: Diagnosis not present

## 2020-02-10 DIAGNOSIS — J45909 Unspecified asthma, uncomplicated: Secondary | ICD-10-CM | POA: Diagnosis not present

## 2020-02-10 DIAGNOSIS — O26812 Pregnancy related exhaustion and fatigue, second trimester: Secondary | ICD-10-CM | POA: Insufficient documentation

## 2020-02-10 DIAGNOSIS — Z20822 Contact with and (suspected) exposure to covid-19: Secondary | ICD-10-CM | POA: Diagnosis not present

## 2020-02-10 DIAGNOSIS — O26819 Pregnancy related exhaustion and fatigue, unspecified trimester: Secondary | ICD-10-CM

## 2020-02-10 LAB — URINALYSIS, COMPLETE (UACMP) WITH MICROSCOPIC
Bacteria, UA: NONE SEEN
Bilirubin Urine: NEGATIVE
Glucose, UA: NEGATIVE mg/dL
Ketones, ur: NEGATIVE mg/dL
Nitrite: NEGATIVE
Protein, ur: NEGATIVE mg/dL
Specific Gravity, Urine: 1.025 (ref 1.005–1.030)
pH: 6 (ref 5.0–8.0)

## 2020-02-10 LAB — CBC
HCT: 36.8 % (ref 36.0–46.0)
Hemoglobin: 12.1 g/dL (ref 12.0–15.0)
MCH: 28.5 pg (ref 26.0–34.0)
MCHC: 32.9 g/dL (ref 30.0–36.0)
MCV: 86.6 fL (ref 80.0–100.0)
Platelets: 180 10*3/uL (ref 150–400)
RBC: 4.25 MIL/uL (ref 3.87–5.11)
RDW: 14.6 % (ref 11.5–15.5)
WBC: 6.4 10*3/uL (ref 4.0–10.5)
nRBC: 0 % (ref 0.0–0.2)

## 2020-02-10 LAB — BASIC METABOLIC PANEL
Anion gap: 8 (ref 5–15)
BUN: 8 mg/dL (ref 6–20)
CO2: 24 mmol/L (ref 22–32)
Calcium: 8.6 mg/dL — ABNORMAL LOW (ref 8.9–10.3)
Chloride: 105 mmol/L (ref 98–111)
Creatinine, Ser: 0.61 mg/dL (ref 0.44–1.00)
GFR calc Af Amer: >60 mL/min (ref >60)
GFR calc non Af Amer: >60 mL/min (ref >60)
Glucose, Bld: 115 mg/dL — ABNORMAL HIGH (ref 70–99)
Potassium: 3.5 mmol/L (ref 3.5–5.1)
Sodium: 137 mmol/L (ref 135–145)

## 2020-02-10 LAB — SARS CORONAVIRUS 2 (TAT 6-24 HRS): SARS Coronavirus 2: NEGATIVE

## 2020-02-10 LAB — TROPONIN I (HIGH SENSITIVITY): Troponin I (High Sensitivity): 7 ng/L (ref <18)

## 2020-02-10 MED ORDER — ALUM & MAG HYDROXIDE-SIMETH 200-200-20 MG/5ML PO SUSP
30.0000 mL | Freq: Once | ORAL | Status: AC
  Administered 2020-02-10: 30 mL via ORAL
  Filled 2020-02-10: qty 30

## 2020-02-10 MED ORDER — LIDOCAINE VISCOUS HCL 2 % MT SOLN
15.0000 mL | Freq: Once | OROMUCOSAL | Status: AC
  Administered 2020-02-10: 15 mL via ORAL
  Filled 2020-02-10: qty 15

## 2020-02-10 NOTE — ED Notes (Signed)
Pt states coming in with some dizziness, but doe not feel like she would faint. Pt states she is 4 months pregnant and has some bilateral leg swelling at times. Pt states daughter was sick at home and pt has had some diarrhea and vomiting. Pt denies covid-19 contact.  Pt resting in bed. Urine cup provided for urine sample. Call bell within reach.

## 2020-02-10 NOTE — ED Triage Notes (Signed)
Pt states she is [redacted] weeks pregnant and having SOb with LE swelling, pt is in nAD at present. Ambulatory , respirations WNL

## 2020-02-10 NOTE — ED Provider Notes (Signed)
[facility] Emergency Department Provider Note   ____________________________________________   First MD Initiated Contact with Patient 02/10/20 1504     (approximate)  I have reviewed the triage vital signs and the nursing notes.   HISTORY  Chief Complaint Shortness of Breath    HPI Brianna Grimes is a 24 y.o. female at approximately 16 weeks of pregnancy who presents to the ED complaining of fatigue and shortness of breath.  Patient reports that she has been feeling fatigued and malaised over the past 4 to 5 days with aching pain in both of her legs.  Her legs have seemed slightly more swollen than usual but equal on both sides.  This is been associated with some difficulty catching her breath when she walks, but she feels like she is breathing normally at rest and denies any associated chest pain.  She has not taken her temperature at home, but reports feeling hot over the weekend, denies any cough, dysuria, or hematuria.  She did have a "GI bug" 5 days ago with some episodes of vomiting but no diarrhea.  Her daughter was recently sick with similar symptoms.  She has not had any complications thus far with this pregnancy and reports a normal ultrasound earlier in the pregnancy.  She denies any dysuria or hematuria.        Past Medical History:  Diagnosis Date  . Asthma   . Vitamin D deficiency     Patient Active Problem List   Diagnosis Date Noted  . Labor and delivery indication for care or intervention 09/29/2018  . Uterine contractions during pregnancy 09/19/2018  . Pregnancy 09/19/2018  . Nausea and vomiting during pregnancy 09/12/2018    Past Surgical History:  Procedure Laterality Date  . NO PAST SURGERIES      Prior to Admission medications   Medication Sig Start Date End Date Taking? Authorizing Provider  Prenatal Vit-Fe Fumarate-FA (MULTIVITAMIN-PRENATAL) 27-0.8 MG TABS tablet Take 1 tablet by mouth daily at 12 noon.   Yes  [provider]  acetaminophen (TYLENOL) 325 MG tablet Take 2 tablets (650 mg total) by mouth every 4 (four) hours as needed for mild pain, moderate pain or headache. Patient not taking: Reported on 02/10/2020 10/01/18   Genia Del, CNM  albuterol (PROVENTIL) (5 MG/ML) 0.5% nebulizer solution Take 2.5 mg by nebulization every 6 (six) hours as needed for wheezing or shortness of breath.    [provider]  ibuprofen (ADVIL,MOTRIN) 600 MG tablet Take 1 tablet (600 mg total) by mouth every 6 (six) hours as needed for mild pain, moderate pain or cramping. Patient not taking: Reported on 02/10/2020 10/01/18   Genia Del, CNM    Allergies Patient has no known allergies.  Family History  Problem Relation Age of Onset  . Diabetes Mother   . Bipolar disorder Mother   . Hypertension Father   . Bipolar disorder Father   . Hypertension Brother   . Bipolar disorder Paternal Grandmother   . Diabetes Maternal Aunt   . Breast cancer Maternal Aunt   . Breast cancer Maternal Aunt     Social History Social History   Tobacco Use  . Smoking status: Never Smoker  . Smokeless tobacco: Never Used  Substance Use Topics  . Alcohol use: No  . Drug use: Not Currently    Types: Marijuana    Review of Systems  Constitutional: Positive for fatigue and fever/chills Eyes: No visual changes. ENT: No sore throat. Cardiovascular: Denies chest pain. Respiratory:  Positive for shortness of breath. Gastrointestinal: No abdominal pain.  No nausea, no vomiting.  No diarrhea.  No constipation. Genitourinary: Negative for dysuria. Musculoskeletal: Negative for back pain.  Positive for leg swelling and cramps. Skin: Negative for rash. Neurological: Negative for headaches, focal weakness or numbness.  ____________________________________________   PHYSICAL EXAM:  VITAL SIGNS: ED Triage Vitals  Enc Vitals Group     BP 02/10/20 1127 136/80     Pulse Rate 02/10/20 1127 83      Resp 02/10/20 1127 18     Temp 02/10/20 1127 97.9 F (36.6 C)     Temp Source 02/10/20 1127 Oral     SpO2 02/10/20 1127 100 %     Weight 02/10/20 1124 288 lb (130.6 kg)     Height 02/10/20 1124 5\' 8"  (1.727 m)     Head Circumference --      Peak Flow --      Pain Score 02/10/20 1124 0     Pain Loc --      Pain Edu? --      Excl. in [place]? --     Constitutional: Alert and oriented. Eyes: Conjunctivae are normal. Head: Atraumatic. Nose: No congestion/rhinnorhea. Mouth/Throat: Mucous membranes are moist. Neck: Normal ROM Cardiovascular: Normal rate, regular rhythm. Grossly normal heart sounds. Respiratory: Normal respiratory effort.  No retractions. Lungs CTAB. Gastrointestinal: Gravid abdomen, soft and nontender. No distention. Genitourinary: deferred Musculoskeletal: No lower extremity tenderness nor edema. Neurologic:  Normal speech and language. No gross focal neurologic deficits are appreciated. Skin:  Skin is warm, dry and intact. No rash noted. Psychiatric: Mood and affect are normal. Speech and behavior are normal.  ____________________________________________   LABS (all labs ordered are listed, but only abnormal results are displayed)  Labs Reviewed  BASIC METABOLIC PANEL - Abnormal; Notable for the following components:      Result Value   Glucose, Bld 115 (*)    Calcium 8.6 (*)    All other components within normal limits  URINALYSIS, COMPLETE (UACMP) WITH MICROSCOPIC - Abnormal; Notable for the following components:   Color, Urine YELLOW (*)    APPearance HAZY (*)    Hgb urine dipstick MODERATE (*)    Leukocytes,Ua TRACE (*)    All other components within normal limits  SARS CORONAVIRUS 2 (TAT 6-24 HRS)  CBC  TROPONIN I (HIGH SENSITIVITY)  TROPONIN I (HIGH SENSITIVITY)   ____________________________________________  EKG  ED ECG REPORT I, Blake Divine, the attending physician, personally viewed and interpreted this ECG.   Date: 02/10/2020  EKG  Time: 11:37  Rate: 77  Rhythm: normal sinus rhythm  Axis: Normal  Intervals:none  ST&T Change: None   PROCEDURES  Procedure(s) performed (including Critical Care):  Procedures   ____________________________________________   INITIAL IMPRESSION / ASSESSMENT AND PLAN / ED COURSE       24 year old female with no significant past medical history at approximately 16 weeks of pregnancy presents to the ED complaining of fatigue, malaise, shortness of breath, and leg swelling with aching pain over the past 4 to 5 days.  She has not taken her temperature at home but states she has felt hot recently, also recently had some nausea and vomiting that she states she caught from her daughter.  Her symptoms sound most likely infectious in etiology, very low suspicion for PE given she has no clinical findings of DVT and is low risk by [place].  Her vital signs are also reassuring, no tachycardia, tachypnea, or hypoxia noted.  EKG  shows no evidence of arrhythmia or ischemia and basic labs thus far unremarkable.  We will check chest x-ray and troponin, perform testing for COVID-19.  If these results are unremarkable, she will be appropriate for discharge home with close follow-up with her OB/GYN.  Troponin within normal limits and chest x-ray negative for acute process.  COVID-19 testing is pending and patient advised to quarantine until results are known.  She continues to breathe comfortably without any shortness of breath here in the ED, is appropriate for discharge home with OB/GYN follow-up.  She was counseled to return to the ED for new worsening symptoms, patient agrees with plan.      ____________________________________________   FINAL CLINICAL IMPRESSION(S) / ED DIAGNOSES  Final diagnoses:  Shortness of breath  Pregnancy related fatigue, antepartum     ED Discharge Orders    None       Note:  This document was prepared using Dragon voice recognition software and may include  unintentional dictation errors.   Chesley Noon, MD 02/10/20 [phone]

## 2020-02-10 NOTE — ED Notes (Signed)
Pt is unable to provider UA. Pt states she will notify RN when pt has urinated.  Call bell within reach. Pt laying in bed. Adult female with pt at bedside.

## 2020-02-26 LAB — OB RESULTS CONSOLE VARICELLA ZOSTER ANTIBODY, IGG: Varicella: IMMUNE

## 2020-02-26 LAB — OB RESULTS CONSOLE RUBELLA ANTIBODY, IGM: Rubella: IMMUNE

## 2020-02-26 LAB — OB RESULTS CONSOLE HIV ANTIBODY (ROUTINE TESTING): HIV: NONREACTIVE

## 2020-02-26 LAB — OB RESULTS CONSOLE HEPATITIS B SURFACE ANTIGEN: Hepatitis B Surface Ag: NEGATIVE

## 2020-06-08 ENCOUNTER — Observation Stay
Admission: EM | Admit: 2020-06-08 | Discharge: 2020-06-08 | Disposition: A | Discharge status: Home / Self Care | Payer: Medicaid Other | Attending: Obstetrics and Gynecology | Admitting: Obstetrics and Gynecology

## 2020-06-08 ENCOUNTER — Other Ambulatory Visit: Payer: Self-pay

## 2020-06-08 DIAGNOSIS — Z3A33 33 weeks gestation of pregnancy: Secondary | ICD-10-CM | POA: Diagnosis not present

## 2020-06-08 DIAGNOSIS — R102 Pelvic and perineal pain: Secondary | ICD-10-CM | POA: Diagnosis not present

## 2020-06-08 DIAGNOSIS — O99891 Other specified diseases and conditions complicating pregnancy: Principal | ICD-10-CM | POA: Insufficient documentation

## 2020-06-08 DIAGNOSIS — N949 Unspecified condition associated with female genital organs and menstrual cycle: Secondary | ICD-10-CM | POA: Diagnosis present

## 2020-06-08 DIAGNOSIS — Z3480 Encounter for supervision of other normal pregnancy, unspecified trimester: Secondary | ICD-10-CM

## 2020-06-08 DIAGNOSIS — Z3490 Encounter for supervision of normal pregnancy, unspecified, unspecified trimester: Secondary | ICD-10-CM

## 2020-06-08 NOTE — Discharge Summary (Signed)
Patient ID: Brianna Grimes MRN: [id number] DOB/AGE: 1996/03/05 24 y.o.  Admit date: 06/08/2020 Discharge date: 06/08/2020  Admission Diagnoses: Round Ligament Pain  Discharge Diagnoses: Round Ligament Pain  Prenatal Procedures: NST  Consults: None  Significant Diagnostic Studies:  No results found for this or any previous visit (from the past 168 hour(s)).  Treatments: none  Hospital Course:  This is a 24 y.o. G2P1001 with IUP at [redacted]w[redacted]d seen for round ligament pain, only on left side, worse with walking.  Also reports some burning on left side abdomen.  Denies leaking of fluid, UCs, cramping, or bleeding.  She reports it has not been bad enough to take Tylenol.  She was observed, fetal heart rate monitoring remained reassuring, and she had no signs/symptoms of other maternal-fetal concerns.  Education completed and precautions discussed.  She was deemed stable for discharge to home with outpatient follow up.  Discharge Physical Exam:  BP (!) 107/54 (BP Location: Right Arm)   Pulse 90   Temp 98.5 F (36.9 C) (Oral)   Resp 18   Ht 5\' 5"  (1.651 m)   Wt 131.1 kg   BMI 48.09 kg/m   General: NAD CV: RRR Pulm: nl effort ABD: s/nd/nt, gravid DVT Evaluation: LE non-ttp, no evidence of DVT on exam.  NST: FHR baseline: 130 bpm Variability: moderate Accelerations: yes Decelerations: none Time: 30 minutes Category/reactivity: appropriate for GA  TOCO: quiet SVE: deferred      Discharge Condition: Stable  Disposition: Discharge disposition: 01-Home or Self Care        Allergies as of 06/08/2020   No Known Allergies     Medication List    STOP taking these medications   albuterol (5 MG/ML) 0.5% nebulizer solution Commonly known as: PROVENTIL   ibuprofen 600 MG tablet Commonly known as: ADVIL     TAKE these medications   acetaminophen 325 MG tablet Commonly known as: Tylenol Take 2 tablets (650 mg total) by mouth every 4 (four) hours as needed for mild  pain, moderate pain or headache.   multivitamin-prenatal 27-0.8 MG Tabs tablet Take 1 tablet by mouth daily at 12 noon.       Follow-up Information    [facility]. Go in 2 day(s).   Why: for already scheduled appt Contact information: [address] [place] [address]              Signed:  [phone] 06/08/2020 10:31 PM

## 2020-06-08 NOTE — OB Triage Note (Signed)
Patient came in for observation for left lower abdominal pain since Friday evening. Patient rates pain 7/10. Patient denies uterine contractions. Patient denies leaking of fluid and denies vaginal bleeding and spotting. Vital signs stable and patient afebrile. FHR baseline 140 with moderate variability with accelerations 15 x 15 and no decelerations. Significant other at bedside. Monitors applied and tracing.

## 2020-06-08 NOTE — Discharge Instructions (Signed)

## 2020-07-02 LAB — OB RESULTS CONSOLE GC/CHLAMYDIA
Chlamydia: NEGATIVE
Gonorrhea: NEGATIVE

## 2020-07-02 LAB — OB RESULTS CONSOLE RPR: RPR: NONREACTIVE

## 2020-07-02 LAB — OB RESULTS CONSOLE GBS: GBS: NEGATIVE

## 2020-07-21 ENCOUNTER — Observation Stay
Admission: EM | Admit: 2020-07-21 | Discharge: 2020-07-22 | Disposition: A | Discharge status: Home / Self Care | Payer: Medicaid Other | Attending: Certified Nurse Midwife | Admitting: Certified Nurse Midwife

## 2020-07-21 ENCOUNTER — Encounter: Payer: Self-pay | Admitting: Obstetrics and Gynecology

## 2020-07-21 ENCOUNTER — Other Ambulatory Visit: Payer: Self-pay

## 2020-07-21 ENCOUNTER — Inpatient Hospital Stay: Payer: Medicaid Other

## 2020-07-21 DIAGNOSIS — O368390 Maternal care for abnormalities of the fetal heart rate or rhythm, unspecified trimester, not applicable or unspecified: Secondary | ICD-10-CM | POA: Diagnosis present

## 2020-07-21 DIAGNOSIS — O98513 Other viral diseases complicating pregnancy, third trimester: Principal | ICD-10-CM | POA: Insufficient documentation

## 2020-07-21 DIAGNOSIS — Z3A39 39 weeks gestation of pregnancy: Secondary | ICD-10-CM | POA: Diagnosis not present

## 2020-07-21 DIAGNOSIS — O99512 Diseases of the respiratory system complicating pregnancy, second trimester: Secondary | ICD-10-CM | POA: Diagnosis not present

## 2020-07-21 DIAGNOSIS — J45909 Unspecified asthma, uncomplicated: Secondary | ICD-10-CM | POA: Insufficient documentation

## 2020-07-21 DIAGNOSIS — O368330 Maternal care for abnormalities of the fetal heart rate or rhythm, third trimester, not applicable or unspecified: Secondary | ICD-10-CM | POA: Insufficient documentation

## 2020-07-21 DIAGNOSIS — R000 Tachycardia, unspecified: Secondary | ICD-10-CM | POA: Diagnosis present

## 2020-07-21 DIAGNOSIS — U071 COVID-19: Secondary | ICD-10-CM | POA: Diagnosis not present

## 2020-07-21 LAB — CBC
HCT: 33.6 % — ABNORMAL LOW (ref 36.0–46.0)
Hemoglobin: 11.1 g/dL — ABNORMAL LOW (ref 12.0–15.0)
MCH: 26.8 pg (ref 26.0–34.0)
MCHC: 33 g/dL (ref 30.0–36.0)
MCV: 81.2 fL (ref 80.0–100.0)
Platelets: 174 10*3/uL (ref 150–400)
RBC: 4.14 MIL/uL (ref 3.87–5.11)
RDW: 16.4 % — ABNORMAL HIGH (ref 11.5–15.5)
WBC: 6 10*3/uL (ref 4.0–10.5)
nRBC: 0 % (ref 0.0–0.2)

## 2020-07-21 LAB — COMPREHENSIVE METABOLIC PANEL
ALT: 25 U/L (ref 0–44)
AST: 25 U/L (ref 15–41)
Albumin: 2.8 g/dL — ABNORMAL LOW (ref 3.5–5.0)
Alkaline Phosphatase: 129 U/L — ABNORMAL HIGH (ref 38–126)
Anion gap: 10 (ref 5–15)
BUN: 9 mg/dL (ref 6–20)
CO2: 21 mmol/L — ABNORMAL LOW (ref 22–32)
Calcium: 8.5 mg/dL — ABNORMAL LOW (ref 8.9–10.3)
Chloride: 103 mmol/L (ref 98–111)
Creatinine, Ser: 0.43 mg/dL — ABNORMAL LOW (ref 0.44–1.00)
GFR calc Af Amer: >60 mL/min (ref >60)
GFR calc non Af Amer: >60 mL/min (ref >60)
Glucose, Bld: 96 mg/dL (ref 70–99)
Potassium: 3.5 mmol/L (ref 3.5–5.1)
Sodium: 134 mmol/L — ABNORMAL LOW (ref 135–145)
Total Bilirubin: 0.4 mg/dL (ref 0.3–1.2)
Total Protein: 7.1 g/dL (ref 6.5–8.1)

## 2020-07-21 LAB — C-REACTIVE PROTEIN: CRP: 1.6 mg/dL — ABNORMAL HIGH (ref <1.0)

## 2020-07-21 LAB — URINALYSIS, COMPLETE (UACMP) WITH MICROSCOPIC
Bilirubin Urine: NEGATIVE
Glucose, UA: NEGATIVE mg/dL
Hgb urine dipstick: NEGATIVE
Ketones, ur: NEGATIVE mg/dL
Nitrite: NEGATIVE
Protein, ur: 30 mg/dL — AB
Specific Gravity, Urine: 1.018 (ref 1.005–1.030)
pH: 6 (ref 5.0–8.0)

## 2020-07-21 LAB — RESPIRATORY PANEL BY RT PCR (FLU A&B, COVID)
Influenza A by PCR: NEGATIVE
Influenza B by PCR: NEGATIVE
SARS Coronavirus 2 by RT PCR: POSITIVE — AB

## 2020-07-21 LAB — FIBRIN DERIVATIVES D-DIMER (ARMC ONLY): Fibrin derivatives D-dimer (ARMC): 1691.32 ng/mL (FEU) — ABNORMAL HIGH (ref 0.00–499.00)

## 2020-07-21 MED ORDER — ALBUTEROL SULFATE HFA 108 (90 BASE) MCG/ACT IN AERS
2.0000 | INHALATION_SPRAY | Freq: Once | RESPIRATORY_TRACT | Status: DC | PRN
  Filled 2020-07-21 (×2): qty 6.7

## 2020-07-21 MED ORDER — LIDOCAINE HCL (PF) 1 % IJ SOLN: 30.0000 mL | INTRAMUSCULAR | Status: DC | PRN

## 2020-07-21 MED ORDER — LACTATED RINGERS IV SOLN
500.0000 mL | INTRAVENOUS | Status: DC | PRN
  Administered 2020-07-21: 1000 mL via INTRAVENOUS

## 2020-07-21 MED ORDER — ONDANSETRON HCL 4 MG/2ML IJ SOLN: 4.0000 mg | Freq: Four times a day (QID) | INTRAMUSCULAR | Status: DC | PRN

## 2020-07-21 MED ORDER — ENOXAPARIN SODIUM 40 MG/0.4ML ~~LOC~~ SOLN
40.0000 mg | Freq: Two times a day (BID) | SUBCUTANEOUS | Status: DC
  Administered 2020-07-21: 40 mg via SUBCUTANEOUS
  Filled 2020-07-21: qty 0.4

## 2020-07-21 MED ORDER — SOD CITRATE-CITRIC ACID 500-334 MG/5ML PO SOLN: 30.0000 mL | ORAL | Status: DC | PRN

## 2020-07-21 MED ORDER — DIPHENHYDRAMINE HCL 50 MG/ML IJ SOLN
50.0000 mg | Freq: Once | INTRAMUSCULAR | Status: DC | PRN
  Filled 2020-07-21: qty 1

## 2020-07-21 MED ORDER — OXYCODONE-ACETAMINOPHEN 5-325 MG PO TABS
1.0000 | ORAL_TABLET | Freq: Four times a day (QID) | ORAL | Status: DC | PRN
  Administered 2020-07-21: 1 via ORAL
  Filled 2020-07-21: qty 1

## 2020-07-21 MED ORDER — LACTATED RINGERS IV SOLN: INTRAVENOUS | Status: DC

## 2020-07-21 MED ORDER — OXYTOCIN-SODIUM CHLORIDE 30-0.9 UT/500ML-% IV SOLN: 2.5000 [IU]/h | INTRAVENOUS | Status: DC

## 2020-07-21 MED ORDER — OXYTOCIN BOLUS FROM INFUSION: 333.0000 mL | Freq: Once | INTRAVENOUS | Status: DC

## 2020-07-21 MED ORDER — METHYLPREDNISOLONE SODIUM SUCC 125 MG IJ SOLR
125.0000 mg | Freq: Once | INTRAMUSCULAR | Status: DC | PRN
  Filled 2020-07-21: qty 2

## 2020-07-21 MED ORDER — EPINEPHRINE 0.3 MG/0.3ML IJ SOAJ
0.3000 mg | Freq: Once | INTRAMUSCULAR | Status: DC | PRN
  Filled 2020-07-21 (×2): qty 0.3

## 2020-07-21 MED ORDER — FENTANYL CITRATE (PF) 100 MCG/2ML IJ SOLN: 50.0000 ug | INTRAMUSCULAR | Status: DC | PRN

## 2020-07-21 MED ORDER — ACETAMINOPHEN 500 MG PO TABS
1000.0000 mg | ORAL_TABLET | Freq: Four times a day (QID) | ORAL | Status: DC | PRN
  Administered 2020-07-21 – 2020-07-22 (×3): 1000 mg via ORAL
  Filled 2020-07-21 (×3): qty 2

## 2020-07-21 MED ORDER — FAMOTIDINE IN NACL 20-0.9 MG/50ML-% IV SOLN
20.0000 mg | Freq: Once | INTRAVENOUS | Status: DC | PRN
  Filled 2020-07-21 (×2): qty 50

## 2020-07-21 MED ORDER — SODIUM CHLORIDE 0.9 % IV SOLN
1200.0000 mg | Freq: Once | INTRAVENOUS | Status: AC
  Administered 2020-07-21: 1200 mg via INTRAVENOUS
  Filled 2020-07-21: qty 10

## 2020-07-21 MED ORDER — SODIUM CHLORIDE 0.9 % IV SOLN: INTRAVENOUS | Status: DC | PRN

## 2020-07-21 NOTE — H&P (Signed)
OB History & Physical   History of Present Illness:  Chief Complaint:   HPI:  Brianna Grimes is a 24 y.o. G2P1001 female at [redacted]w[redacted]d dated by LMP of 10/21/2019, c/w [place] at [redacted]w[redacted]d.  She presents to L&D for maternal and fetal tachycardia.  Brianna Grimes was evaluated in the office today.  She states "I just don't feel well." Maternal tachycardia 140-160bpm and fetal tachycardia at 170bpm were noted on exam.  Sent to L&D for further evaluation.   Reports active fetal movement  Contractions: denies  LOF/SROM: denies  Vaginal bleeding: denies   Pregnancy Issues: 1. Prepregnancy BMI > 40 2. History of prediabetes  3. Close interval pregnancy 4. Vitamin D Deficiency  5. Anxiety  6. Uterine S>D - 07/02/2020 EFW 6lbs 6oz, 3220U, 42% 7. Elevated 1hr GTT- 3hr WNL   Patient Active Problem List   Diagnosis Date Noted  . Maternal care for fetal tachycardia during pregnancy 07/21/2020  . Anxiety 12/20/2018  . Episode of recurrent major depressive disorder (HCC) 12/20/2018  . Encounter for supervision of normal pregnancy in multigravida 09/19/2018  . 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 BMI (body mass index), pediatric, greater than 99% for age 02/14/2013  . 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 Vitamin D insufficiency 02/14/2013     Maternal Medical History:   Past Medical History:  Diagnosis Date  . Asthma   . Vitamin D deficiency     Past Surgical History:  Procedure Laterality Date  . NO PAST SURGERIES      No Known Allergies  Prior to Admission medications   Medication Sig Start Date End Date Taking? Authorizing Provider  acetaminophen (TYLENOL) 325 MG tablet Take 2 tablets (650 mg total) by mouth every 4 (four) hours as needed for mild pain, moderate pain or headache. 10/01/18   Genia Del, CNM  Prenatal Vit-Fe Fumarate-FA (MULTIVITAMIN-PRENATAL) 27-0.8 MG TABS tablet Take 1 tablet by mouth daily at 12 noon.    [provider]     Prenatal care site:   [facility]  Social History: She  reports that she has never smoked. She has never used smokeless tobacco. She reports previous drug use. Drug: Marijuana. She reports that she does not drink alcohol.  Family History: family history includes Bipolar disorder in her father, mother, and paternal grandmother; Breast cancer in her maternal aunt and maternal aunt; Diabetes in her maternal aunt and mother; Hypertension in her brother and father.   Review of Systems: A full review of systems was performed and negative except as noted in the HPI.     Physical Exam:  Vital Signs: BP 133/78 (BP Location: Left Arm)   Pulse (!) 129   Temp 99.2 F (37.3 C) (Oral)   Resp 18   SpO2 96%  Physical Exam  General: no acute distress.  HEENT: normocephalic, atraumatic Heart: regular rate & rhythm.  No murmurs/rubs/gallops, + tachycardia  Lungs: clear to auscultation bilaterally, normal respiratory effort Abdomen: soft, gravid, non-tender;  EFW: 7 1/2 lbs  Pelvic:   External: Normal external female genitalia  Cervix: deferred    Extremities: non-tender, symmetric, no edema bilaterally.  DTRs: 2+/2+  Neurologic: Alert & oriented x 3.    No results found for this or any previous visit (from the past 24 hour(s)).  Pertinent Results:  Prenatal Labs: Blood type/Rh A pos   Antibody screen neg  Rubella Immune  Varicella Immune  RPR NR  HBsAg Neg  HIV NR  GC neg  Chlamydia neg  Genetic screening  negative  1 hour GTT 141  3 hour GTT 88, 148, 132, 126  GBS Neg    FHT: Baseline: 160 bpm, Variability: moderate, Accelerations: absent and Decelerations: Absent TOCO: none SVE: deferred    Cephalic by [place] and SVE   No results found.  Assessment:  Brianna Grimes is a 24 y.o. G2P1001 female at [redacted]w[redacted]d with maternal tachycardia.   Plan:  1. Admit to Labor & Delivery; consents reviewed and obtained - Covid admission screen - Plan discussed with Dr. Dalbert Garnet    2. Fetal Well  being  - Fetal Tracing: cat 1 - Group B Streptococcus ppx not indicated: GBS neg - Presentation: cephalic confirmed by SVE   3. Routine OB: - Prenatal labs reviewed, as above - Rh pos - CBC, T&S, RPR on admit - Clear fluids, IVF  4. Maternal tachycardia  - Continuous pulse ox - CBC, CMP pending  - UA and Urine culture  - Covid admission screen - Continuous monitoring  - Maternal temp every 2 hours   5. Immunizations in pregnancy  - Tdap vaccine: given 05/07/2020 - Flu vaccine: given 06/24/2020 - Covid-19 Vaccine - desired, unknown if started vaccine series   Gustavo Lah, CNM 07/21/20 3:19 PM  Margaretmary Eddy, CNM Certified Nurse Midwife [facility]

## 2020-07-21 NOTE — OB Triage Note (Signed)
Pt. Presents to the labor and delivery department. States that she is experiencing pain in her pubic area (dull), lower back (general) and legs (throbbing). Denies leaking fluid or vaginal bleeding. Fetal activity is good.

## 2020-07-21 NOTE — Progress Notes (Signed)
PHARMACIST - PHYSICIAN COMMUNICATION  CONCERNING:  Enoxaparin (Lovenox) for DVT Prophylaxis    RECOMMENDATION: Patient was prescribed enoxaprin 40mg  q24 hours for VTE prophylaxis.   Filed Weights   07/21/20 1510  Weight: (!) 141.1 kg (311 lb)    Body mass index is 51.75 kg/m.  Estimated Creatinine Clearance: 157.8 mL/min (A) (by C-G formula based on SCr of 0.43 mg/dL (L)).  Based on [facility] policy patient is candidate for enoxaparin 40mg  every 12 hours due to BMI being >40.  DESCRIPTION: Pharmacy has adjusted enoxaparin dose per [facility] policy.  Patient is now receiving enoxaparin _40___mg every _12_ hours    , PharmD Clinical Pharmacist  07/21/2020 10:01 PM

## 2020-07-21 NOTE — Progress Notes (Signed)
Pharmacy COVID-19 Monoclonal Antibody Screening  Brianna Grimes was identified as being not hospitalized with symptoms from Covid-19 on admission but an incidental positive PCR has been documented.  The patient may qualify for the use of monoclonal antibodies (mAB) for COVID-19 viral infection to prevent worsening symptoms stemming from Covid-19 infection.  The patient was identified based on a positive COVID-19 PCR and not requiring the use of supplemental oxygen at this time.  This patient meets the FDA criteria for Emergency Use Authorization of casirivimab/imdevimab or bamlanivimab/etesevimab.  Has a (+) direct SARS-CoV-2 viral test result  Is NOT hospitalized due to COVID-19  Is within 10 days of symptom onset  Has at least one of the high risk factor(s) for progression to severe COVID-19 and/or hospitalization as defined in EUA.  Specific high risk criteria : BMI > 25 and Pregnancy  Additionally: The patient has not had a positive COVID-19 PCR in the last 90 days.  The patient is unvaccinated against COVID-19.  Since the patient is unvaccinated and meets high risk criteria, the patient is eligible for mAB administration.   This eligibility and indication for treatment was discussed with the patient's physician: Dr. Fran Lowes  Plan: Based on the above discussion, it was decided that the patient will receive one dose of the available COVID-19 mAB combination. Pharmacy will coordinate administration timing with patient's nurse. Recommended infusion monitoring parameters communicated to the nursing team.   Tressie Ellis 07/21/2020  7:35 PM

## 2020-07-21 NOTE — Consult Note (Signed)
Medical Consultation   Brianna Grimes  HFW:[id number]  DOB: 1995/11/15  DOA: 07/21/2020  PCP: [facility]  Requesting physician: Brianna [facility], CNM  Reason for consultation: COVID-19 infection  History of Present Illness: Brianna Grimes is a 24 y.o. G31P1001 female at [redacted]w[redacted]d on presentation who was send to the hospital for maternal and fetal tachycardia.  Tryniti was evaluated in the office when Maternal HR noted 140-160bpm and fetal HR at 170bpm on exam.  Pt reported not feeling well.  Started to develop a dry cough on the day of presentation.  Had taken tylenol for some aches and pains.  No measured fevers.  No dyspnea, chest pain, N/V/D, dysuria, increased swelling.  On presentation, pt was afebrile, sinus tach, HDS, and sating well on room air.  CBC and CMP unremarkable, no leukocytosis.  COVID-19 returned positive, for which hospitalist medicine is consulted.  Pt is unvaccinated.     Review of Systems:  As per HPI otherwise complete review of systems negative.   Past Medical History: Past Medical History:  Diagnosis Date  . Asthma   . Vitamin D deficiency     Past Surgical History: Past Surgical History:  Procedure Laterality Date  . NO PAST SURGERIES       Allergies:  No Known Allergies   Social History:  reports that she has never smoked. She has never used smokeless tobacco. She reports previous drug use. Drug: Marijuana. She reports that she does not drink alcohol.   Family History: Family History  Problem Relation Age of Onset  . Diabetes Mother   . Bipolar disorder Mother   . Hypertension Father   . Bipolar disorder Father   . Hypertension Brother   . Bipolar disorder Paternal Grandmother   . Diabetes Maternal Aunt   . Breast cancer Maternal Aunt   . Breast cancer Maternal Aunt      Physical Exam: Vitals:   07/21/20 2053 07/21/20 2055 07/21/20 2100 07/21/20 2108  BP: 125/74     Pulse: (!) 120     Resp: 19      Temp: 98.4 F (36.9 C)     TempSrc: Oral     SpO2: 97% 98% 99% 99%  Weight:      Height:        Constitutional: NAD, AAOx3 HEENT: conjunctivae and lids normal, EOMI CV: RRR tachycardic, no M,R,G. Distal pulses +2.  No cyanosis.   RESP: CTA B/L, normal respiratory effort  GI: +BS, NT, gravid Extremities: No effusions, edema in BLE SKIN: warm, dry and intact Neuro: II - XII grossly intact.  Sensation intact Psych: Normal mood and affect.  Appropriate judgement and reason   Data reviewed:  I have personally reviewed following labs and imaging studies Labs:  CBC: Recent Labs  Lab 07/21/20 1530  WBC 6.0  HGB 11.1*  HCT 33.6*  MCV 81.2  PLT 174    Basic Metabolic Panel: Recent Labs  Lab 07/21/20 1530  NA 134*  K 3.5  CL 103  CO2 21*  GLUCOSE 96  BUN 9  CREATININE 0.43*  CALCIUM 8.5*   GFR Estimated Creatinine Clearance: 157.8 mL/min (A) (by C-G formula based on SCr of 0.43 mg/dL (L)). Liver Function Tests: Recent Labs  Lab 07/21/20 1530  AST 25  ALT 25  ALKPHOS 129*  BILITOT 0.4  PROT 7.1  ALBUMIN 2.8*   No results for input(s): LIPASE, AMYLASE in the last  168 hours. No results for input(s): AMMONIA in the last 168 hours. Coagulation profile No results for input(s): INR, PROTIME in the last 168 hours.  Cardiac Enzymes: No results for input(s): CKTOTAL, CKMB, CKMBINDEX, TROPONINI in the last 168 hours. BNP: Invalid input(s): POCBNP CBG: No results for input(s): GLUCAP in the last 168 hours. D-Dimer No results for input(s): DDIMER in the last 72 hours. Hgb A1c No results for input(s): HGBA1C in the last 72 hours. Lipid Profile No results for input(s): CHOL, HDL, LDLCALC, TRIG, CHOLHDL, LDLDIRECT in the last 72 hours. Thyroid function studies No results for input(s): TSH, T4TOTAL, T3FREE, THYROIDAB in the last 72 hours.  Invalid input(s): FREET3 Anemia work up No results for input(s): VITAMINB12, FOLATE, FERRITIN, TIBC, IRON, RETICCTPCT in the  last 72 hours. Urinalysis    Component Value Date/Time   COLORURINE YELLOW (A) 07/21/2020 1507   APPEARANCEUR HAZY (A) 07/21/2020 1507   LABSPEC 1.018 07/21/2020 1507   PHURINE 6.0 07/21/2020 1507   GLUCOSEU NEGATIVE 07/21/2020 1507   HGBUR NEGATIVE 07/21/2020 1507   BILIRUBINUR NEGATIVE 07/21/2020 1507   KETONESUR NEGATIVE 07/21/2020 1507   PROTEINUR 30 (A) 07/21/2020 1507   NITRITE NEGATIVE 07/21/2020 1507   LEUKOCYTESUR SMALL (A) 07/21/2020 1507     Microbiology Recent Results (from the past 240 hour(s))  Respiratory Panel by RT PCR (Flu A&B, Covid) - Nasopharyngeal Swab     Status: Abnormal   Collection Time: 07/21/20  3:07 PM   Specimen: Nasopharyngeal Swab  Result Value Ref Range Status   SARS Coronavirus 2 by RT PCR POSITIVE (A) NEGATIVE Final    Comment: RESULT CALLED TO, READ BACK BY AND VERIFIED WITH: JESSICA LUNSFORD 07/21/20 AT 1604 BY ACR (NOTE) SARS-CoV-2 target nucleic acids are DETECTED.  SARS-CoV-2 RNA is generally detectable in upper respiratory specimens  during the acute phase of infection. Positive results are indicative of the presence of the identified virus, but do not rule out bacterial infection or co-infection with other pathogens not detected by the test. Clinical correlation with patient history and other diagnostic information is necessary to determine patient infection status. The expected result is Negative.  Fact Sheet for Patients:  https://www.moore.com/  Fact Sheet for Healthcare Providers: https://www.young.biz/  This test is not yet approved or cleared by the [place] FDA and  has been authorized for detection and/or diagnosis of SARS-CoV-2 by FDA under an Emergency Use Authorization (EUA).  This EUA will remain in effect (meaning this test ca n be used) for the duration of  the COVID-19 declaration under Section 564(b)(1) of the Act, 21 U.S.C. section 360bbb-3(b)(1), unless the  authorization is terminated or revoked sooner.      Influenza A by PCR NEGATIVE NEGATIVE Final   Influenza B by PCR NEGATIVE NEGATIVE Final    Comment: (NOTE) The Xpert Xpress SARS-CoV-2/FLU/RSV assay is intended as an aid in  the diagnosis of influenza from Nasopharyngeal swab specimens and  should not be used as a sole basis for treatment. Nasal washings and  aspirates are unacceptable for Xpert Xpress SARS-CoV-2/FLU/RSV  testing.  Fact Sheet for Patients: https://www.moore.com/  Fact Sheet for Healthcare Providers: https://www.young.biz/  This test is not yet approved or cleared by the [place] FDA and  has been authorized for detection and/or diagnosis of SARS-CoV-2 by  FDA under an Emergency Use Authorization (EUA). This EUA will remain  in effect (meaning this test can be used) for the duration of the  Covid-19 declaration under Section 564(b)(1) of the Act, 21  U.S.C. section 360bbb-3(b)(1), unless the authorization is  terminated or revoked. Performed at [facility], [address] [place], [place] [address]      Inpatient Medications:   Scheduled Meds: . enoxaparin (LOVENOX) injection  40 mg Subcutaneous Q24H  . oxytocin 40 units in LR 1000 mL  333 mL Intravenous Once   Continuous Infusions: . sodium chloride    . casirivimab-imdevimab (REGEN-COV) IVPB    . famotidine (PEPCID) IV    . lactated ringers 1,000 mL (07/21/20 1537)  . lactated ringers    . oxytocin       Radiological Exams on Admission: DG Chest Port 1 View  Result Date: 07/21/2020 CLINICAL DATA:  Short of breath.  Positive COVID.  Pregnant. EXAM: PORTABLE CHEST 1 VIEW COMPARISON:  None. FINDINGS: Normal mediastinum and cardiac silhouette. Normal pulmonary vasculature. No evidence of effusion, infiltrate, or pneumothorax. No acute bony abnormality. IMPRESSION: No acute cardiopulmonary process. No evidence of pulmonary infection.  Electronically Signed   By: Genevive Bi M.D.   On: 07/21/2020 20:23    Impression/Recommendations Active Problems:   Maternal care for fetal tachycardia during pregnancy   # COVID-19 infection --No hypoxia.  Mild symptoms of dry cough and tachycardia.  Unvaccinated.   PLAN: --Pt qualifies for and ordered COVID-19 monoclonal antibody --obtain CXR, CRP, D-dimer  --Monitor for O2 sat  # Tachycardia --likely due to COVID infection.  UA with small leuk and rare bacteria.  Urine cx pending.  No N/V/D.  No other source of infection identified currently. PLAN: --Treat COVID as above --obtain D-dimer, and if significantly elevated, may need to consider PE   Thank you for this consultation.  Our [facility] hospitalist team will follow the patient with you.   Darlin Priestly M.D. [facility] 07/21/2020, 10:00 PM

## 2020-07-22 DIAGNOSIS — R000 Tachycardia, unspecified: Secondary | ICD-10-CM | POA: Diagnosis present

## 2020-07-22 DIAGNOSIS — O98513 Other viral diseases complicating pregnancy, third trimester: Secondary | ICD-10-CM | POA: Diagnosis not present

## 2020-07-22 DIAGNOSIS — O368390 Maternal care for abnormalities of the fetal heart rate or rhythm, unspecified trimester, not applicable or unspecified: Secondary | ICD-10-CM

## 2020-07-22 DIAGNOSIS — U071 COVID-19: Secondary | ICD-10-CM | POA: Diagnosis not present

## 2020-07-22 LAB — CBC
HCT: 31.3 % — ABNORMAL LOW (ref 36.0–46.0)
Hemoglobin: 10.2 g/dL — ABNORMAL LOW (ref 12.0–15.0)
MCH: 26.9 pg (ref 26.0–34.0)
MCHC: 32.6 g/dL (ref 30.0–36.0)
MCV: 82.6 fL (ref 80.0–100.0)
Platelets: 137 10*3/uL — ABNORMAL LOW (ref 150–400)
RBC: 3.79 MIL/uL — ABNORMAL LOW (ref 3.87–5.11)
RDW: 16.3 % — ABNORMAL HIGH (ref 11.5–15.5)
WBC: 6.1 10*3/uL (ref 4.0–10.5)
nRBC: 0 % (ref 0.0–0.2)

## 2020-07-22 LAB — FIBRIN DERIVATIVES D-DIMER (ARMC ONLY): Fibrin derivatives D-dimer (ARMC): 1845.83 ng/mL (FEU) — ABNORMAL HIGH (ref 0.00–499.00)

## 2020-07-22 LAB — RPR: RPR Ser Ql: NONREACTIVE

## 2020-07-22 LAB — BASIC METABOLIC PANEL
Anion gap: 9 (ref 5–15)
BUN: 8 mg/dL (ref 6–20)
CO2: 22 mmol/L (ref 22–32)
Calcium: 8.1 mg/dL — ABNORMAL LOW (ref 8.9–10.3)
Chloride: 105 mmol/L (ref 98–111)
Creatinine, Ser: 0.44 mg/dL (ref 0.44–1.00)
GFR calc Af Amer: >60 mL/min (ref >60)
GFR calc non Af Amer: >60 mL/min (ref >60)
Glucose, Bld: 97 mg/dL (ref 70–99)
Potassium: 3.3 mmol/L — ABNORMAL LOW (ref 3.5–5.1)
Sodium: 136 mmol/L (ref 135–145)

## 2020-07-22 LAB — TYPE AND SCREEN
ABO/RH(D): A POS
Antibody Screen: NEGATIVE

## 2020-07-22 LAB — URINE CULTURE

## 2020-07-22 LAB — MAGNESIUM: Magnesium: 1.6 mg/dL — ABNORMAL LOW (ref 1.7–2.4)

## 2020-07-22 LAB — TSH: TSH: 0.82 u[IU]/mL (ref 0.350–4.500)

## 2020-07-22 LAB — T4, FREE: Free T4: 0.63 ng/dL (ref 0.61–1.12)

## 2020-07-22 MED ORDER — METOPROLOL TARTRATE 25 MG PO TABS
12.5000 mg | ORAL_TABLET | Freq: Two times a day (BID) | ORAL | Status: DC
  Filled 2020-07-22: qty 0.5
  Filled 2020-07-22 (×2): qty 1

## 2020-07-22 MED ORDER — POTASSIUM CHLORIDE CRYS ER 10 MEQ PO TBCR
30.0000 meq | EXTENDED_RELEASE_TABLET | ORAL | Status: DC
  Administered 2020-07-22: 30 meq via ORAL
  Filled 2020-07-22: qty 3

## 2020-07-22 MED ORDER — MAGNESIUM SULFATE 2 GM/50ML IV SOLN
2.0000 g | Freq: Once | INTRAVENOUS | Status: AC
  Administered 2020-07-22: 2 g via INTRAVENOUS
  Filled 2020-07-22: qty 50

## 2020-07-22 NOTE — Discharge Instructions (Signed)
COVID-19 COVID-19 El COVID-19 es una infeccin respiratoria causada por un virus llamado coronavirus tipo 2 causante del sndrome respiratorio agudo grave (SARS-CoV-2). La enfermedad tambin se conoce como enfermedad por coronavirus o nuevo coronavirus. En algunas personas, el virus puede no ocasionar sntomas. En otras, puede producir una infeccin grave. La infeccin puede empeorar rpidamente y causar complicaciones, como:  Neumona o infeccin en los pulmones.  Sndrome de dificultad respiratoria aguda o SDRA. Es una afeccin que se caracteriza por la acumulacin de lquido en los pulmones, que impide que los pulmones se llenen de aire y pasen oxgeno a la sangre.  Insuficiencia respiratoria aguda. Es una afeccin que se caracteriza porque no pasa suficiente oxgeno de los pulmones al cuerpo o porque el dixido de carbono no pasa de los pulmones hacia afuera del cuerpo.  Sepsis o choque sptico. Se trata de una reaccin grave del cuerpo ante una infeccin.  Problemas de coagulacin.  Infecciones secundarias debido a bacterias u hongos.  Falla de rganos. Ocurre cuando los rganos del cuerpo dejan de funcionar. El virus que causa el COVID-19 es contagioso. Esto significa que puede transmitirse de una persona a otra a travs de las gotitas de saliva de la tos y de los estornudos (secreciones respiratorias). Cules son las causas? Esta enfermedad es causada por un virus. Usted puede contagiarse con este virus:  Al inspirar las gotitas de una persona infectada. Las gotitas pueden diseminarse cuando una persona respira, habla, canta, tose o estornuda.  Al tocar algo, como una mesa o el picaportes de una puerta, que estuvo expuesto al virus (contaminado) y luego tocarse la boca, nariz o los ojos. Qu incrementa el riesgo? Riesgo de infeccin Es ms probable que se infecte con este virus si:  Se encuentra a una distancia menor a 6 pies (2 metros) de una persona con COVID-19.  Cuida o  vive con una persona infectada con COVID-19.  Pasa tiempo en espacios interiores repletos de gente o vive en viviendas compartidas. Riesgo de enfermedad grave Es ms probable que se enferme gravemente por el virus si:  Tiene 50aos o ms. Cuanto mayor sea su edad, mayor ser el riesgo de tener una enfermedad grave.  Vive en un hogar de ancianos o centro de atencin a largo plazo.  Tiene cncer.  Tiene una enfermedad prolongada (crnica), como las siguientes: ? Enfermedad pulmonar crnica, que incluye la enfermedad pulmonar obstructiva crnica o asma. ? Una enfermedad crnica que disminuye la capacidad del cuerpo para combatir las infecciones (immunocomprometido). ? Enfermedad cardaca, que incluye insuficiencia cardaca, una afeccin que se caracteriza porque las arterias que llegan al corazn se estrechan u obstruyen (arteriopata coronaria) o una enfermedad que provoca que el msculo cardaco se engrose, se debilite o endurezca (miocardiopata). ? Diabetes. ? Enfermedad renal crnica. ? Anemia drepanoctica, una enfermedad que se caracteriza porque los glbulos rojos tienen una forma anormal de "hoz". ? Enfermedad heptica.  Es obeso. Cules son los signos o sntomas? Los sntomas de esta afeccin pueden ser de leves a graves. Los sntomas pueden aparecer en el trmino de 2 a 14 das despus de haber estado expuesto al virus. Incluyen los siguientes:  Fiebre o escalofros.  Tos.  Dificultad para respirar.  Dolores de cabeza, dolores en el cuerpo o dolores musculares.  Secrecin o congestin nasal.  Dolor de garganta.  Nueva prdida del sentido del gusto o del olfato. Algunas personas tambin pueden tener problemas estomacales, como nuseas, vmitos o diarrea. Es posible que otras personas no tengan sntomas de COVID-19.   Cmo se diagnostica? Esta afeccin se puede diagnosticar en funcin de lo siguiente:  Sus signos y sntomas, especialmente si: ? Vive en una zona  donde hay un brote de COVID-19. ? Viaj recientemente a una zona donde el virus es frecuente. ? Cuida o vive con una persona a quien se le diagnostic COVID-19. ? Estuvo expuesto a una persona a la que se le diagnostic COVID-19.  Un examen fsico.  Anlisis de laboratorio que pueden incluir: ? Tomar una muestra de lquido de la parte posterior de la nariz y la garganta (lquido nasofarngeo), la nariz o la garganta, con un hisopo. ? Una muestra de mucosidad de los pulmones (esputo). ? Anlisis de sangre.  Los estudios de diagnstico por imgenes pueden incluir radiografas, exploracin por tomografa computarizada (TC) o ecografa. Cmo se trata? En este momento, no hay ningn medicamento para tratar el COVID-19. Los medicamentos para tratar otras enfermedades se usan a modo de ensayo para comprobar si son eficaces contra el COVID-19. El mdico le informar sobre las maneras de tratar los sntomas. En la mayora de las personas, la infeccin es leve y puede controlarse en el hogar con reposo, lquidos y medicamentos de venta libre. El tratamiento para una infeccin grave suele realizarse en la unidad de cuidados intensivos (UCI) de un hospital. Puede incluir uno o ms de los siguientes. Estos tratamientos se administran hasta que los sntomas mejoran.  Recibir lquidos y medicamentos a travs de una va intravenosa.  Oxgeno complementario. Para administrar oxgeno extra, se utiliza un tubo en la nariz, una mascarilla o una campana de oxgeno.  Colocarlo para que se recueste boca abajo (decbito prono). Esto facilita el ingreso de oxgeno a los pulmones.  Uso continuo de una mquina de presin positiva de las vas areas (CPAP) o de presin positiva de las vas areas de dos niveles (BPAP). Este tratamiento utiliza una presin de aire leve para mantener las vas respiratorias abiertas. Un tubo conectado a un motor administra oxgeno al cuerpo.  Respirador. Este tratamiento mueve el aire  dentro y fuera de los pulmones mediante el uso de un tubo que se coloca en la trquea.  Traqueostoma. En este procedimiento se hace un orificio en el cuello para insertar un tubo de respiracin.  Oxigenacin por membrana extracorprea (OMEC). En este procedimiento, los pulmones tienen la posibilidad de recuperarse al asumir las funciones del corazn y los pulmones. Suministra oxgeno al cuerpo y elimina el dixido de carbono. Siga estas instrucciones en su casa: Estilo de vida  Si est enfermo, qudese en su casa, excepto para obtener atencin mdica. El mdico le indicar cunto tiempo debe quedarse en casa. Llame al mdico antes de buscar atencin mdica.  Haga reposo en su casa como se lo haya indicado el mdico.  No consuma ningn producto que contenga nicotina o tabaco, como cigarrillos, cigarrillos electrnicos y tabaco de mascar. Si necesita ayuda para dejar de fumar, consulte al mdico.  Retome sus actividades normales segn lo indicado por el mdico. Pregntele al mdico qu actividades son seguras para usted. Instrucciones generales  Use los medicamentos de venta libre y los recetados solamente como se lo haya indicado el mdico.  Beba suficiente lquido como para mantener la orina de color amarillo plido.  Concurra a todas las visitas de seguimiento como se lo haya indicado el mdico. Esto es importante. Cmo se evita?  No hay ninguna vacuna que ayude a prevenir la infeccin por COVID-19. Sin embargo, hay medidas que puede tomar para protegerse y proteger a   otras personas de este virus. Para protegerse:   No viaje a zonas donde el COVID-19 sea un riesgo. Las zonas donde se informa la presencia del COVID-19 cambian con frecuencia. Para identificar las zonas de alto riesgo y las restricciones de viaje, consulte el sitio web de viajes de los Centers for Disease Control and Prevention (CDC) (Centros para el Control y la Prevencin de Enfermedades):  wwwnc.cdc.gov/travel/notices  Si vive o debe viajar a una zona donde el COVID-19 es un riesgo, tome precauciones para evitar infecciones. ? Aljese de las personas enfermas. ? Lvese las manos frecuentemente con agua y jabn durante 20segundos. Use desinfectante para manos con alcohol si no dispone de agua y jabn. ? Evite tocarse la boca, la cara, los ojos o la nariz. ? Evite salir de su casa, siga las indicaciones de su estado y de las autoridades sanitarias locales. ? Si debe salir de su casa, use un barbijo de tela o una mascarilla facial. Asegrese de que le cubra la nariz y la boca. ? Evite los espacios interiores repletos de gente. Mantenga una distancia de al menos 6 pies (2 metros) de otras personas. ? Desinfecte los objetos y las superficies que se tocan con frecuencia todos los das. Pueden incluir:  Encimeras y mesas.  Picaportes e interruptores de luz.  Lavabos, fregaderos y grifos.  Aparatos electrnicos tales como telfonos, controles remotos, teclados, computadoras y tabletas. Cmo proteger a los dems: Si tiene sntomas de COVID-19, tome medidas para evitar que el virus se propague a otras personas.  Si cree que tiene una infeccin por COVID-19, comunquese de inmediato con su mdico. Informe al equipo de atencin mdica que cree que puede tener una infeccin por el COVID-19.  Qudese en su casa. Salga de su casa solo para buscar atencin mdica. No utilice el transporte pblico.  No viaje mientras est enfermo.  Lvese las manos frecuentemente con agua y jabn durante 20segundos. Usar desinfectante para manos con alcohol si no dispone de agua y jabn.  Mantngase alejado de quienes vivan con usted. Permita que los miembros de la familia sanos cuiden a los nios y las mascotas, si es posible. Si tiene que cuidar a los nios o las mascotas, lvese las manos con frecuencia y use un barbijo. Si es posible, permanezca en su habitacin, separado de los dems. Utilice un  bao diferente.  Asegrese de que todas las personas que viven en su casa se laven bien las manos y con frecuencia.  Tosa o estornude en un pauelo de papel o sobre su manga o codo. No tosa o estornude al aire ni se cubra la boca o la nariz con la mano.  Use un barbijo de tela o una mascarilla facial. Asegrese de que le cubra la nariz y la boca. Dnde buscar ms informacin  Centers for Disease Control and Prevention (Centros para el Control y la Prevencin de Enfermedades): www.cdc.gov/coronavirus/2019-ncov/index.html  World Health Organization (Organizacin Mundial de la Salud): www.who.int/health-topics/coronavirus Comunquese con un mdico si:  Vive o ha viajado a una zona donde el COVID-19 es un riesgo y tiene sntomas de infeccin.  Ha tenido contacto con alguien que tiene COVID-19 y usted tiene sntomas de infeccin. Solicite ayuda inmediatamente si:  Tiene dificultad para respirar.  Siente dolor u opresin en el pecho.  Experimenta confusin.  Tiene las uas de los dedos y los labios de color azulado.  Tiene dificultad para despertarse.  Los sntomas empeoran. Estos sntomas pueden representar un problema grave que constituye una emergencia. No espere   a ver si los sntomas desaparecen. Solicite atencin mdica de inmediato. Comunquese con el servicio de emergencias de su localidad (911 en los [place]). No conduzca por sus propios medios [employer] hospital. Informe al personal mdico de emergencias si cree que tiene COVID-19. Resumen  El COVID-19 es una infeccin respiratoria causada por un virus. Tambin se conoce como enfermedad por coronavirus o nuevo coronavirus. Puede causar infecciones graves, como neumona, sndrome de dificultad respiratoria aguda, insuficiencia respiratoria aguda o sepsis.  El virus que causa el COVID-19 es contagioso. Esto significa que puede transmitirse de [place] persona a otra a travs de las gotitas que se despiden al respirar, Heritage manager,  cantar, toser y Engineering geologist.  Es ms probable que desarrolle una enfermedad grave si tiene 50 aos o ms, tiene el sistema inmunitario dbil, vive en un hogar de ancianos o tiene una enfermedad crnica.  No hay ningn medicamento para tratar el COVID-19. El mdico le informar sobre las maneras de tratar los sntomas.  Tome medidas para protegerse y Conservator, museum/gallery a los Merchandiser, retail las infecciones. Lvese las manos con frecuencia y desinfecte los objetos y las superficies que se tocan con frecuencia todos [place]. Mantngase alejado de las personas que estn enfermas y use un barbijo si est enfermo. Esta informacin no tiene Theme park manager el consejo del mdico. Asegrese de hacerle al mdico cualquier pregunta que tenga. Document Revised: 08/14/2019 Document Reviewed: 12/07/2018 Elsevier Patient Education  2020 Elsevier Inc.  COVID-19: Cmo protegerse y proteger a los dems COVID-19: How to Protect Yourself and Others Sepa cmo se propaga  Actualmente, no existe ninguna vacuna para prevenir la enfermedad por coronavirus 2019 (COVID-19).  La mejor forma de prevenir la enfermedad es evitar exponerse a este virus.  Se cree que el virus se transmite principalmente de [place] persona a [place]. ? [employer] que estn en contacto directo entre s (a una distancia inferior a 6 pies [1.23m]). ? A travs de las gotitas respiratorias producidas cuando una persona infectada tose, estornuda o habla. ? Estas gotitas pueden caer en la boca o en la nariz de las personas que estn cerca o pueden ser [employer] pulmones. ? El COVID-19 puede ser transmitido por personas que no presentan sntomas. Lo que todos deben hacer [employer] las manos con frecuencia  Lvese las manos con frecuencia con agua y jabn durante al menos 20 segundos, especialmente despus de Oceanographer en un lugar pblico o despus de sonarse la nariz, toser o estornudar.  Si no dispone de [place] y [place], use un desinfectante  de manos que contenga al menos un 60% de alcohol. Cubra todas las superficies de las manos y frtelas hasta que se sientan secas.  No se toque los ojos, la nariz y la boca sin antes lavarse las manos. Evite el contacto cercano  Limite el contacto directo con otras personas tanto como sea posible.  Evite el contacto cercano con personas que estn enfermas.  Establezca distancia entre usted y [employer]. ? Recuerde que [employer] no tienen sntomas pueden transmitir el virus. ? Esto es Software engineer para las personas que tienen ms riesgo de [email or web address] Cbrase la boca y la nariz con una mascarilla cuando est cerca de otras personas  Puede transmitir el COVID-19 a otras personas aunque no se sienta enfermo.  Todas las Statistician en lugares pblicos y cuando estn con otras que no vivan en su casa, especialmente cuando el distanciamiento social sea difcil de [place]. ? Las  mascarillas no deben colocarse a nios menores de 2 aos de [address], a las personas que tienen problemas respiratorios o que estn inconscientes, incapacitadas o que por algn motivo no puedan quitarse la mascarilla sin [place].  El propsito de Nurse, adult es proteger a Economist en caso de que usted est infectado.  NO utilice las [employer] a los trabajadores de [employer].  Contine manteniendo una distancia aproximada de 6 pies (1.57m) entre usted y [employer]. La mascarilla no reemplaza el distanciamiento social. Cbrase al toser y estornudar  Al toser o al estornudar, siempre cbrase la boca y la nariz con un pauelo descartable o use el interior del codo.  Deseche los pauelos descartables usados en la basura.  Inmediatamente, lvese las manos con agua y [place] durante al menos 20segundos. Si no dispone de agua y [place], [employer] con un desinfectante de  manos que contenga al menos un 60% de alcohol. Limpie y desinfecte  Limpie Y desinfecte las superficies que se tocan con frecuencia todos los [address]. Esto incluye mesas, picaportes, interruptores de luz, encimeras, mangos, escritorios, telfonos, teclados, inodoros, grifos y lavabos. [email or web address]  Si las superficies estn sucias, lmpielas: Use detergente o jabn y agua antes de la desinfeccin.  Luego, use un desinfectante domstico. Puede consultar una lista de los desinfectantes domsticos registrados en Financial controller (EPA) (Agencia de Proteccin Ambiental) aqu. [email or web address] 06/25/2019 Esta informacin no tiene Theme park manager el consejo del mdico. Asegrese de hacerle al mdico cualquier pregunta que tenga. Document Revised: 07/09/2019 Document Reviewed: 05/02/2019 Elsevier Patient Education  2020 Elsevier Inc.   Preguntas frecuentes sobre el COVID-19 COVID-19 Frequently Asked Questions El COVID-19 (enfermedad por coronavirus) es una infeccin causada por una gran familia de virus. Algunos virus causan [place] y otros causan enfermedades en animales tales como los camellos, los gatos y los murcilagos. En algunos casos, los virus que causan [employer] pueden transmitirse a los seres humanos. De dnde provino el coronavirus? En diciembre de 2019, [place] le inform a Chief Technology Officer (Organizacin Mundial de la [place], Best boy) acerca de varios casos de enfermedad pulmonar (enfermedad respiratoria humana). Estos casos estaban vinculados con un mercado abierto de frutos de mar y [place] en la ciudad de [place]. El vnculo con el mercado de ganado y [employer] sugiere que el virus puede haberse propagado de los animales a los [place]. Sin embargo, desde Chiropodist brote en diciembre, tambin se ha demostrado que el virus se contagia de [place] persona a  Educational psychologist. Cul es el nombre de la enfermedad y del virus? Nombre de la enfermedad Al principio, esta enfermedad se llam nuevo coronavirus. Esto se debe a que los cientficos determinaron que la enfermedad era causada por un nuevo virus respiratorio. [employer] (Organizacin Mundial de la [place], [place]) ahora ha dado a la enfermedad el nombre de COVID-19, o enfermedad por coronavirus. Nombre del virus El virus causante de la enfermedad se conoce como coronavirus de tipo 2 causante del sndrome respiratorio agudo grave (SARS-CoV-2). Ms informacin sobre el nombre de la enfermedad y el virus Workd Health Organization (Organizacin Mundial de la Olivia Lopez de Gutierrez) (OMS): www.who.int/emergencies/diseases/novel-coronavirus-2019/technical-guidance/naming-the-coronavirus-disease-(covid-2019)-and-the-virus-that-causes-it Quines estn en riesgo de sufrir complicaciones debido a la enfermedad por coronavirus? Algunas personas pueden tener un riesgo ms alto de tener complicaciones debido a la enfermedad por coronavirus. Entre ellas se encuentran los [employer] y las personas que tienen enfermedades crnicas, como enfermedad cardaca, diabetes y enfermedad pulmonar. Si tiene un riesgo ms  alto de Advice worker, tome estas precauciones adicionales:  Public affairs consultant en su casa todo lo que sea posible.  [place] reuniones sociales y los viajes.  Evitar el contacto cercano con Producer, television/film/video. Permanecer a una distancia de al menos 6 pies (2 m) de las [employer], si es posible.  Lavarse las manos frecuentemente con agua y [place] durante al menos 20segundos.  Evitar tocarse la cara, la boca, la nariz y los ojos.  Tener a [employer] su casa, como alimentos, medicamentos y productos de limpieza.  Si debe salir de su casa, use un barbijo de tela o una mascarilla facial. Asegrese de que le cubra la nariz y la boca. Cmo se transmite la enfermedad causada por el coronavirus? El  virus que causa la enfermedad por coronavirus se transmite fcilmente de Ardelia Mems persona a otra (es contagioso). Usted puede contagiarse con este virus:  Al inspirar las gotitas de una persona infectada. Las [employer] pueden diseminarse cuando una persona respira, habla, canta, tose o estornuda.  Al tocar algo, como una mesa o el picaportes de [place], que estuvo expuesto al virus (contaminado) y luego tocarse la boca, nariz o los ojos. Puedo contraer al virus al tocar superficies u objetos? Todava hay mucho que no se conoce acerca del virus que causa la enfermedad por coronavirus. Los cientficos basan gran parte de la informacin en lo que saben sobre virus similares, por ejemplo:  En general, los virus no sobreviven en superficies durante mucho tiempo. Necesitan un cuerpo humano (husped) para sobrevivir.  Es ms probable que el virus se contagie por contacto cercano con personas que estn enfermas (contacto directo), por ejemplo: ? Al estrechar las manos o abrazarse. ? Al inhalar las gotitas respiratorias que se desplazan por el aire. Las [employer] pueden diseminarse cuando una persona respira, habla, canta, tose o estornuda.  Es menos probable que el virus se propague cuando una persona toca una superficie o un objeto sobre el que est el virus (contacto indirecto). El virus puede ingresar al cuerpo si la persona toca una superficie o un objeto y [employer] se toca la cara, los ojos, la nariz o la boca. Una persona puede contagiar el virus sin tener sntomas de la enfermedad? Puede ser posible que el virus se contagie antes de que la persona tenga sntomas de la enfermedad, pero muy probablemente esta no sea la principal forma en que el virus se est propagando. Es ms probable que el virus se propague al estar en contacto estrecho con personas que estn enfermas e inhalar las gotas respiratorias que una persona disemina al respirar, Electrical engineer, cantar, toser o estornudar. Cules son los sntomas de la  enfermedad causada por el coronavirus? Los sntomas varan de Ardelia Mems persona a otra y pueden variar de leves a graves. [employer], se pueden incluir los siguientes:  Systems analyst o escalofros.  Tos.  Dificultad para respirar o falta de aire.  Dolores de [place], dolores en el cuerpo o dolores musculares.  Secrecin o congestin nasal.  El dolor de garganta.  Nueva prdida del sentido del gusto o del olfato.  Nuseas, vmitos o diarrea. Estos sntomas pueden aparecer en el trmino de 2 a [address] despus de haber estado expuesto al virus. Algunas personas quizs no tengan sntomas. Si presenta sntomas, llame al mdico. Las personas con sntomas graves pueden necesitar atencin hospitalaria. Debo hacerme un anlisis de deteccin del virus? El mdico decidir si debe realizarse un anlisis en funcin de sus sntomas, antecedentes de exposicin  y factores de [place]. Cmo realiza el mdico el anlisis para detectar este virus? Los mdicos obtienen muestras para enviar a Chiropractor. Estas muestras pueden incluir lo siguiente:  Tomar con un hisopo Lauris Poag de lquido de la parte posterior de la nariz y la garganta, la nariz o la garganta.  Pedirle que tosa mucosidad (esputo) para extraer lquido de los pulmones en un recipiente estril.  Tomar una muestra de [place]. Hay algn tratamiento o vacuna para este virus? Actualmente, no existe ninguna vacuna para prevenir la enfermedad por coronavirus. Adems, no existen [employer] antibiticos o los antivirales para tratar el virus. Una persona que se enferma recibe tratamiento de apoyo, lo que significa reposo y lquidos. Una persona tambin puede aliviar sus sntomas con medicamentos de venta libre para tratar los estornudos, la tos y el goteo nasal. Son los mismos medicamentos que se toman para el resfro comn. Si presenta sntomas, llame al mdico. Las personas con sntomas graves pueden necesitar atencin hospitalaria. Qu  puedo hacer para protegerme y proteger a mi familia de este virus?     Puede protegerse y proteger a su familia tomando las mismas medidas que tomara para prevenir el contagio de otros virus. [employer] las siguientes medidas:  Lavarse las manos frecuentemente con agua y [place] durante al menos 20segundos. Usar desinfectante para manos con alcohol si no dispone de [place] y [place].  Evitar tocarse la cara, la boca, la nariz y los ojos.  Toser o estornudar en un pauelo descartable, sobre su manga o codo. No toser o estornudar al aire ni cubrirse con la [place]. ? Si tose o estornuda en un pauelo de papel, deschelo inmediatamente y [employer].  Desinfectar los [employer] y las superficies que se tocan con frecuencia todos [place].  Aljese de [employer].  Evite salir de su casa, siga las indicaciones de su estado y de las autoridades sanitarias locales.  Evite los espacios interiores repletos de gente. Permanezca a una distancia de al menos 6 pies (2 m) de las [employer].  Si debe salir de su casa, use un barbijo de tela o una mascarilla facial. Asegrese de que le cubra la nariz y la boca.  Lennie Hummer en su casa si est enfermo, excepto para obtener atencin mdica. Llame al mdico antes de buscar atencin mdica. El mdico le indicar cunto tiempo debe quedarse en casa.  Asegrese de [employer]Star las vacunas al da. Pregntele al mdico qu vacunas necesita. Qu debo hacer si tengo que viajar? Siga las recomendaciones relacionadas con los viajes de la autoridad de Psychiatrist, los CDC y Engineer, civil (consulting). Informacin y consejos para Nurse, adult for Disease Control and Prevention Insurance claims handler) (Centros para el Control y la Prevencin de Event organiser): [email or web address]  Organizacin Mundial de Radiographer, therapeutic (OMS): [email or web address].se Fisher Scientific riesgos y tome medidas para proteger su salud  El riesgo de  Primary school teacher la enfermedad por coronavirus es ms alto si viaja a zonas con un brote o si est en contacto con viajeros que provienen de zonas donde hay un brote.  Lvese las manos con frecuencia y [place] higiene [place] para reducir el riesgo de contagiarse o transmitir el virus. Qu debo hacer si estoy enfermo? Instrucciones generales para detener la propagacin de la infeccin  Lavarse las manos frecuentemente con agua y jabn durante al menos 20segundos. Usar desinfectante para manos con alcohol si no dispone de [place] y [place].  Toser o estornudar en un pauelo descartable, sobre su manga o codo. No  toser o estornudar al aire ni cubrirse con la [place].  Si tose o estornuda en un pauelo de papel, deschelo inmediatamente y [employer].  Lanny Hurst en su casa a menos que deba recibir [employer]. Llame al mdico o a la autoridad de salud local antes de buscar atencin mdica.  Evite las zonas pblicas. No viaje en transporte pblico, de ser posible.  Si puede, use un barbijo si debe salir de la casa o si est en contacto cercano con alguien que no est enfermo. Asegrese de que le cubra la nariz y la boca. Mantenga su casa limpia  Desinfecte los objetos y las superficies que se tocan con frecuencia todos [place]. Pueden incluir: ? Encimeras y [place]. ? Picaportes e interruptores de luz. ? Lavabos, fregaderos y grifos. ? Aparatos electrnicos tales como telfonos, controles remotos, teclados, computadoras y tabletas.  Lave los platos con agua jabonosa caliente o en el lavavajillas. Deje los platos para que se sequen al aire.  Lave la ropa con agua caliente. Evite infectar a otros miembros de la familia  Permita que los miembros de la familia sanos cuiden a los nios y las [place], si es posible. Si tiene que cuidar a los nios o las mascotas, lvese las manos con frecuencia y use un barbijo.  Duerma en una habitacin o cama diferentes, si es posible.  No comparta elementos  personales, como afeitadoras, cepillos de dientes, desodorantes, peines, cepillos, toallas y [address] de [address]. Dnde buscar ms informacin Centers for Disease Control and Prevention (CDC)  Actualizaciones de informacin y novedades: [email or web address].cz Organizacin Mundial de la Salud (OMS)  Actualizaciones de informacin y novedades: AffordableSalon.es  Tema de salud relacionado con el coronavirus: [email or web address]  Preguntas y Environmental health practitioner sobre COVID-19: [email or web address]  Registro mundial: who.sprinklr.com American Academy of Pediatrics (AAP) (Academia Estadounidense de Pediatra)  Informacin para familias: www.healthychildren.org/English/health-issues/conditions/chest-lungs/Pages/2019-Novel-Coronavirus.aspx La situacin del coronavirus cambia rpidamente. Consulte el sitio web de su autoridad de Psychiatrist o los sitios web de los CDC y la OMS para enterarse de las novedades y noticias. Cundo debo comunicarme con un mdico?  Comunquese con su mdico si tiene sntomas de infeccin, como fiebre o tos, y: ? Arlean Hopping cerca de alguien que sabe que tiene la enfermedad por coronavirus. ? Arlean Hopping en contacto con una persona que presuntamente sufra de la enfermedad por coronavirus. ? Ha viajado a una zona donde hay un brote de COVID-19. Cundo debo buscar asistencia mdica inmediata?  Busque ayuda de inmediato llamando al servicio de emergencias de su localidad (911 en los [place]) si tiene lo siguiente: ? Dificultad para respirar. ? Dolor u opresin en el pecho. ? Confusin. ? Labios y uas de [employer]. ? Dificultad para despertarse. ? Sntomas que empeoran. Informe al personal mdico de emergencias si cree que tiene la enfermedad por coronavirus. Resumen  Un nuevo virus respiratorio se propaga de Neomia Dear persona a otra y causa COVID-19 (enfermedad por  coronavirus).  El virus que causa el COVID-19 parece diseminarse fcilmente. Se transmite de [place] persona a otra a travs de las [employer]ands se despiden al respirar, Heritage manager, cantar, toser o estornudar.  Los ONEOK y las personas que tienen enfermedades crnicas tienen mayor riesgo de Writer enfermedad. Si tiene un riesgo ms alto de tener complicaciones, tome Engineer, materials.  Actualmente, no existe ninguna vacuna para prevenir la enfermedad por coronavirus. No existen medicamentos, como los antibiticos o los antivirales, para tratar el virus.  Puede protegerse y proteger a su familia al lavarse  las manos con frecuencia, evitar tocarse la cara y cubrirse al toser y Engineering geologistestornudar. Esta informacin no tiene Theme park managercomo fin reemplazar el consejo del mdico. Asegrese de hacerle al mdico cualquier pregunta que tenga. Document Revised: 08/14/2019 Document Reviewed: 02/17/2019 Elsevier Patient Education  2020 ArvinMeritorElsevier Inc.   EllsworthEmbarazo y COVID-19 Pregnancy and COVID-19 La enfermedad por coronavirus, que tambin se conoce como COVID-19, es una infeccin de los pulmones y las vas respiratorias. En este momento, no se sabe con certeza si el embarazo hace ms probable que una persona contraiga COVID-19, o qu efectos puede tener la infeccin en un beb en gestacin. No obstante, el embarazo provoca cambios en el corazn, los pulmones y el sistema del organismo que combate las enfermedades (sistema inmunitario). Algunos de Continental Airlinesestos cambios hacen que sea ms probable que se enferme y tenga una enfermedad ms grave. Por lo tanto, es importante que tome precauciones para protegerse y Conservator, museum/galleryproteger a su beb en gestacin. Se han realizado estudios que demuestran que la obesidad y la diabetes pueden ponerla en mayor riesgo de contraer enfermedades graves. Si est embarazada y es obesa o tiene diabetes, debe tomar precauciones adicionales para protegerse del virus. Trabaje con su equipo de atencin mdica  para elaborar un plan para protegerse de todas las infecciones, incluido el COVID-19. Esta es una forma de mantenerse sana durante el embarazo y de Pharmacologistmantener sano a su beb tambin. Jill Alexanderse qu modo me afecta? Si se contagia con COVID-19, existe el riesgo de que pueda:  Primary school teacherContraer una enfermedad respiratoria que puede ocasionar neumona.  Dar a luz al beb antes de las 37semanas de Psychiatristembarazo (nacimiento prematuro). Si usted tiene o Surveyor, miningpuede tener COVID-19, el mdico puede recomendarle precauciones especiales relacionadas con Firefighterel embarazo. Esto puede afectar lo siguiente:  La forma en que recibe la atencin antes del parto (cuidado prenatal). Puede cambiar la forma en que realiza sus visitas al American Expressmdico. Es posible que los estudios y las ecografas deban hacerse de Shady Coveotra manera.  La forma en que recibe atencin durante el Dobsontrabajo de parto y Schuylerel parto. Esto puede afectar el plan de parto, incluso quines podrn estar con usted durante el Everetttrabajo de Woodruffparto y Jamestownel parto.  La forma en que recibe atencin despus de que d a luz al beb (atencin posparto). Es posible que deba quedarse ms Duke Energytiempo en el hospital y en una habitacin especial.  La forma en que alimenta al beb despus de que nazca. El Psychiatristembarazo puede ser un momento muy estresante debido a los cambios que suceden en el cuerpo y la preparacin que implica convertirse en Cedar Heightsmadre. Adems, puede sentirse especialmente temerosa, ansiosa o estresada debido al COVID-19 y cmo la est afectando. De qu modo lo afecta al beb? Se desconoce si una madre puede transmitir el virus a su beb en gestacin. Si se contagia con COVID-19, existen los siguientes riesgos:  Que el virus causante de COVID-19 pueda transmitirse al beb.  Que tenga el beb tenga un nacimiento prematuro. El beb puede necesitar ms atencin mdica si esto ocurre. Qu puedo hacer para disminuir el riesgo?  No hay ninguna vacuna que ayude a prevenir el COVID-19. Sin embargo, hay medidas que puede  tomar para protegerse y Conservator, museum/galleryproteger a Economistotras personas de este virus. Limpieza e higiene personal  Lavarse las manos frecuentemente con agua y jabn durante al menos 20segundos. Usar desinfectante para manos con alcohol si no dispone de [place] y [place]jabn.  Evitar tocarse la boca, la cara, los ojos o la [place].  Limpiar y  desinfectar los objetos y las superficies que se tocan con frecuencia todos [place]. Estos incluyen: ? Encimeras y [place]. ? Picaportes e interruptores de luz. ? Lavabos, fregaderos y grifos. ? Aparatos electrnicos tales como telfonos, controles remotos, teclados, computadoras y tabletas. Mantenerse alejada de otras personas  Mantenerse alejada de las personas que estn enfermas, si es posible.  Evitar las reuniones sociales y los viajes.  Permanecer en su casa todo lo que sea posible. Siga estas instrucciones: Lactancia materna Se desconoce si el virus causante del COVID-19 puede transmitirse a travs de la Colgate Palmolive al beb. Debe hacer un plan para alimentar al beb con su familia y su equipo de atencin mdica. Si usted tiene o Surveyor, mining COVID-19, el mdico puede recomendarle que tome precauciones mientras [place], por ejemplo:  Lavarse las manos antes de alimentar al beb.  Usar Henry Schein alimenta al beb.  Extraerse la leche materna manualmente o con sacaleche para Corporate treasurer al beb. De ser posible, pdale a alguien de su hogar que no est enfermo que alimente al beb con la Mirant usted se extrae. ? Lvese las manos antes de tocar las piezas del sacaleche. ? Verdie Drown y desinfecte todas las piezas del sacaleche despus de extraer [place]. Siga las instrucciones del fabricante para limpiar y Environmental education officer todas las piezas del Midwife. Instrucciones generales  Si cree que tiene una infeccin por COVID-19, comunquese de inmediato con su mdico. Informe a su mdico que cree que puede tener una infeccin por COVID-19.  Siga las instrucciones del mdico  acerca de cmo tomar los medicamentos. Es posible que no sea seguro tomar algunos medicamentos durante Firefighter.  Cbrase la boca y la nariz usando una mascarilla u otra cubierta de tela sobre el rostro cuando salga a lugares pblicos.  Busque maneras de Charity fundraiser. Estos incluyen: ? Practicar tcnicas de relajacin, como meditacin y respiracin profunda. ? Practicar actividad fsica con regularidad. La Harley-Davidson de las mujeres pueden continuar su rutina de ejercicios durante el [place]. Pregntele al mdico qu actividades son seguras para usted. ? Buscar apoyo de familiares, amigos o recursos espirituales. Si no pueden estar juntos en persona, igualmente puede conectarse por llamadas telefnicas, mensajes de texto, videollamadas o mensajes en lnea. ? Dedicar tiempo a Education officer, environmental actividades relajantes que disfrute, como escuchar msica o leer un buen libro.  Pida ayuda si tiene necesidades nutricionales o de asesoramiento Academic librarian. El mdico puede aconsejarla o recomendarle recursos o derivarla a especialistas para que la ayuden con diferentes necesidades.  Concurra a todas las visitas de [address] se lo haya indicado el mdico. Esto es importante. Dnde buscar ms informacin Centers for Disease Control and Prevention Insurance claims handler) (Centros para el Control y la Prevencin de Event organiser): AffordableShare.com.br World Health Organization (Organizacin Mundial de la [place]) (OMS): PokerPortraits.es Paediatric nurse (ACOG) (Colegio Estadounidense de Obstetras y Scientific laboratory technician): BuyDucts.dk Preguntas para hacerle a su equipo de atencin mdica  Qu debo hacer si tengo sntomas de COVID-19?  Cmo afectar el COVID-19 mis visitas de cuidados prenatales, los estudios y [place], mi [place]  de parto y [place], y la atencin posparto?  Debo planificar amamantar a mi beb?  Dnde puedo encontrar recursos de salud mental?  Dnde puedo encontrar apoyo si tengo preocupaciones econmicas? Comunquese con un mdico si:  Tiene signos y sntomas de infeccin, tales como fiebre y tos. Informe al equipo de atencin mdica que cree que puede tener una infeccin por el COVID-19.  Tiene emociones fuertes,  como tristeza o ansiedad.  Se siente insegura en su casa y necesita ayuda para encontrar un lugar seguro para vivir.  Tiene secrecin vaginal acuosa o con sangre o sangrado vaginal. Solicite ayuda inmediatamente si:  Tiene signos o sntomas de trabajo de parto antes de las 37 semanas de [place]. Esto incluye lo siguiente: ? Contracciones separadas unas de otras por intervalos de 5 minutos o menos, o que aumentan en frecuencia, intensidad o duracin. ? Dolor repentino y fuerte en el abdomen o en la parte inferior de la espalda. ? Presenta un chorro o goteo de lquido proveniente de la vagina.  Tiene signos de enfermedad ms grave, por ejemplo: ? Tiene dificultad para respirar. ? Siente dolor en el pecho. ? Tiene fiebre de ms de 102F (39C) o ms que no desaparece. ? No puede beber lquidos sin vomitar. ? Se siente muy dbil o se desmaya. Estos sntomas pueden representar un problema grave que constituye Radio broadcast assistant. No espere a ver si los sntomas desaparecen. Solicite atencin mdica de inmediato. Comunquese con el servicio de emergencias de su localidad (911 en los [place]). No conduzca por sus propios medios OfficeMax Incorporated. Resumen  La enfermedad por coronavirus, que tambin se conoce como COVID-19, es una infeccin de los pulmones y las vas respiratorias. En este momento, no se sabe con certeza si el embarazo hace que sea ms susceptible al COVID-19 y Risk manager se conoce qu efectos puede tener en los bebs en gestacin.  Es importante que tome precauciones  para protegerse y Conservator, museum/gallery a su beb en gestacin. Esto incluye lavarse las manos con frecuencia, evitar tocarse la boca, el rostro, los ojos y Architectural technologist, Automotive engineer las reuniones sociales y los viajes, y Research scientist (life sciences) alejada de las personas que estn enfermas.  Si cree que tiene una infeccin por COVID-19, comunquese de inmediato con su mdico. Informe a su mdico que cree que puede tener una infeccin por COVID-19.  Si tiene o Colgate COVID-19, el mdico puede recomendarle precauciones especiales durante el [place], el [place] de parto y [place], y despus de que nazca el beb. Esta informacin no tiene Theme park manager el consejo del mdico. Asegrese de hacerle al mdico cualquier pregunta que tenga. Document Revised: 08/14/2019 Document Reviewed: 05/28/2019 Elsevier Patient Education  2020 ArvinMeritor.

## 2020-07-22 NOTE — Discharge Summary (Signed)
Patient ID: Ronae Noell MRN: [id number] DOB/AGE: 22-21-1997 22 y.o.  Admit date: 07/21/2020 Discharge date: 07/22/2020  Admission Diagnoses:  -Maternal tachycardia -Fetal tachycardia  Discharge Diagnoses:  -Maternal tachycardia  -Fetal tachycardia, resolve -COVID-19 positive  Prenatal Procedures: NST  Consults: Hospitalist  Significant Diagnostic Studies:  Results for orders placed or performed during the hospital encounter of 07/21/20 (from the past 168 hour(s))  Respiratory Panel by RT PCR (Flu A&B, Covid) - Nasopharyngeal Swab   Collection Time: 07/21/20  3:07 PM   Specimen: Nasopharyngeal Swab  Result Value Ref Range   SARS Coronavirus 2 by RT PCR POSITIVE (A) NEGATIVE   Influenza A by PCR NEGATIVE NEGATIVE   Influenza B by PCR NEGATIVE NEGATIVE  Urinalysis, Complete w Microscopic   Collection Time: 07/21/20  3:07 PM  Result Value Ref Range   Color, Urine YELLOW (A) YELLOW   APPearance HAZY (A) CLEAR   Specific Gravity, Urine 1.018 1.005 - 1.030   pH 6.0 5.0 - 8.0   Glucose, UA NEGATIVE NEGATIVE mg/dL   Hgb urine dipstick NEGATIVE NEGATIVE   Bilirubin Urine NEGATIVE NEGATIVE   Ketones, ur NEGATIVE NEGATIVE mg/dL   Protein, ur 30 (A) NEGATIVE mg/dL   Nitrite NEGATIVE NEGATIVE   Leukocytes,Ua SMALL (A) NEGATIVE   RBC / HPF 0-5 0 - 5 RBC/hpf   WBC, UA 0-5 0 - 5 WBC/hpf   Bacteria, UA RARE (A) NONE SEEN   Squamous Epithelial / LPF 6-10 0 - 5   Mucus PRESENT   Type and screen   Collection Time: 07/21/20  3:07 PM  Result Value Ref Range   ABO/RH(D) A POS    Antibody Screen NEG    Sample Expiration      07/24/2020,2359 Performed at [facility], [address] [place], [place] [address]   CBC   Collection Time: 07/21/20  3:30 PM  Result Value Ref Range   WBC 6.0 4.0 - 10.5 K/uL   RBC 4.14 3.87 - 5.11 MIL/uL   Hemoglobin 11.1 (L) 12.0 - 15.0 g/dL   HCT 68.3 (L) 36 - 46 %   MCV 81.2 80.0 - 100.0 fL   MCH 26.8 26.0 - 34.0 pg   MCHC 33.0 30.0  - 36.0 g/dL   RDW 41.9 (H) 62.2 - 29.7 %   Platelets 174 150 - 400 K/uL   nRBC 0.0 0.0 - 0.2 %  Comprehensive metabolic panel   Collection Time: 07/21/20  3:30 PM  Result Value Ref Range   Sodium 134 (L) 135 - 145 mmol/L   Potassium 3.5 3.5 - 5.1 mmol/L   Chloride 103 98 - 111 mmol/L   CO2 21 (L) 22 - 32 mmol/L   Glucose, Bld 96 70 - 99 mg/dL   BUN 9 6 - 20 mg/dL   Creatinine, Ser 9.89 (L) 0.44 - 1.00 mg/dL   Calcium 8.5 (L) 8.9 - 10.3 mg/dL   Total Protein 7.1 6.5 - 8.1 g/dL   Albumin 2.8 (L) 3.5 - 5.0 g/dL   AST 25 15 - 41 U/L   ALT 25 0 - 44 U/L   Alkaline Phosphatase 129 (H) 38 - 126 U/L   Total Bilirubin 0.4 0.3 - 1.2 mg/dL   GFR calc non Af Amer >60 >60 mL/min   GFR calc Af Amer >60 >60 mL/min   Anion gap 10 5 - 15  RPR   Collection Time: 07/21/20  3:30 PM  Result Value Ref Range   RPR Ser Ql NON REACTIVE NON REACTIVE  C-reactive protein   Collection Time: 07/21/20  5:57 PM  Result Value Ref Range   CRP 1.6 (H) <1.0 mg/dL  Fibrin derivatives D-Dimer ([facility] only)   Collection Time: 07/21/20  5:57 PM  Result Value Ref Range   Fibrin derivatives D-dimer ([facility]) 1,691.32 (H) 0.00 - 499.00 ng/mL (FEU)  Basic metabolic panel   Collection Time: 07/22/20  6:38 AM  Result Value Ref Range   Sodium 136 135 - 145 mmol/L   Potassium 3.3 (L) 3.5 - 5.1 mmol/L   Chloride 105 98 - 111 mmol/L   CO2 22 22 - 32 mmol/L   Glucose, Bld 97 70 - 99 mg/dL   BUN 8 6 - 20 mg/dL   Creatinine, Ser 5.00 0.44 - 1.00 mg/dL   Calcium 8.1 (L) 8.9 - 10.3 mg/dL   GFR calc non Af Amer >60 >60 mL/min   GFR calc Af Amer >60 >60 mL/min   Anion gap 9 5 - 15  CBC   Collection Time: 07/22/20  6:38 AM  Result Value Ref Range   WBC 6.1 4.0 - 10.5 K/uL   RBC 3.79 (L) 3.87 - 5.11 MIL/uL   Hemoglobin 10.2 (L) 12.0 - 15.0 g/dL   HCT 93.8 (L) 36 - 46 %   MCV 82.6 80.0 - 100.0 fL   MCH 26.9 26.0 - 34.0 pg   MCHC 32.6 30.0 - 36.0 g/dL   RDW 18.2 (H) 99.3 - 71.6 %   Platelets 137 (L) 150 - 400 K/uL    nRBC 0.0 0.0 - 0.2 %  Magnesium   Collection Time: 07/22/20  6:38 AM  Result Value Ref Range   Magnesium 1.6 (L) 1.7 - 2.4 mg/dL  Fibrin derivatives D-Dimer ([facility] only)   Collection Time: 07/22/20  6:38 AM  Result Value Ref Range   Fibrin derivatives D-dimer ([facility]) 1,845.83 (H) 0.00 - 499.00 ng/mL (FEU)  TSH   Collection Time: 07/22/20  8:36 AM  Result Value Ref Range   TSH 0.820 0.350 - 4.500 uIU/mL  T4, free   Collection Time: 07/22/20  8:36 AM  Result Value Ref Range   Free T4 0.63 0.61 - 1.12 ng/dL    Treatments: IV hydration; analgesia: acetaminophen and Percocet; monoclonal antibodies  Hospital Course:  This is a 24 y.o. G2P1001 with IUP at [redacted]w[redacted]d admitted for maternal and fetal tachycardia. Admission labs were notable for COVID-19. She was treated with IV hydration and Tylenol for mild fever with Tmax 100.7. She was also administered monoclonal antibodies. Her tachycardia improved from 120s-130s down to 100s. She reported regular fetal movement, no contractions, no vaginal bleeding, and no leakage of fluid. Fetal monitoring was also reassuring. She was deemed stable for discharge to home from both [facility] and hospitalist teams.   Advised to monitor heartrate and oxygen level at home with pulse oximeter, which can be readily purchased at [employer]. Also reviewed labor precautions in detail.    Discharge Physical Exam:  BP (!) 110/55   Pulse (!) 104   Temp 99.1 F (37.3 C) (Oral)   Resp 18   Ht 5\' 5"  (1.651 m)   Wt (!) 141.1 kg   SpO2 98%   BMI 51.75 kg/m   General: NAD CV: RRR Pulm: CTABL, nl effort ABD: s/nd/nt, gravid DVT Evaluation: LE non-ttp, no evidence of DVT on exam.  FHT: baseline 145bpm, moderate variability, 15x15 accels, no decels TOCO: quiet   Discharge Condition: Stable  Disposition:  Discharge disposition: 01-Home or Self Care  Allergies as of 07/22/2020   No Known Allergies     Medication List    STOP taking these  medications   aspirin 81 MG EC tablet     TAKE these medications   acetaminophen 325 MG tablet Commonly known as: Tylenol Take 2 tablets (650 mg total) by mouth every 4 (four) hours as needed for mild pain, moderate pain or headache.   albuterol 108 (90 Base) MCG/ACT inhaler Commonly known as: VENTOLIN HFA Inhale 2 puffs into the lungs every 4 (four) hours as needed for wheezing.   ergocalciferol 1.25 MG (50000 UT) capsule Commonly known as: VITAMIN D2 Take 50,000 Units by mouth once a week.   multivitamin-prenatal 27-0.8 MG Tabs tablet Take 1 tablet by mouth daily at 12 noon.        SignedGenia Del 07/22/2020 2:04 PM ----- Genia Del, CNM Certified Nurse Midwife [facility]

## 2020-07-22 NOTE — Progress Notes (Addendum)
PROGRESS NOTE    Brianna Grimes  GLO:[id number] DOB: 1996-02-03 DOA: 07/21/2020 PCP: [facility]    Brief Narrative:  Brianna Grimes is a 24 year old female G2 P1001 who is currently 43w1don presentation who presented to the hospital with maternal and fetal tachycardia.  Patient reported overall not feeling well with dry cough.  She has been utilizing Tylenol for myalgias.  Denies fever, no shortness of breath, no chest pain, no nausea/vomiting/diarrhea, no dysuria or worsening lower extremity edema.  On presentation to the hospital, patient was afebrile with a sinus tachycardia.  Patient was oxygenating well on room air.  CBC/CMP unrevealing.  Urinalysis unremarkable.  Covid-19 returned back positive.  Patient was admitted under the OB/GYN service.  Hospitalist medicine consulted for sinus tachycardia and positive Covid-19.   Assessment & Plan:   Active Problems:   Maternal care for fetal tachycardia during pregnancy   Sinus tachycardia   COVID-19 virus infection   Covid-19 viral infection Patient presenting to the hospital with elevated heart rate and feeling ill with dry cough.  Covid-19 PCR positive on 07/21/2020.  Chest x-ray with no acute cardiopulmonary disease process.  Given that patient is asymptomatic, she met criteria to receive monoclonal antibody infusion and received dose of casirivimab-imdevimab 12053mon 07/21/2020.  Patient remains symptom-free and oxygenating well on room air.  Recommend Covid-19 vaccination in 90 days.  Also recommend home intermittent monitoring of her pulse oximetry at home.  Discussed with patient and family at bedside regarding signs/symptoms to be aware of including shortness of breath, chest pain, worsening lower extremity edema, and dizziness and to return to the hospital for further evaluation.  Further recommend home isolation for 14 days on discharge.  Sinus tachycardia Etiology likely secondary to Covid-19 infection.   Patient is afebrile without leukocytosis, and urinalysis unrevealing, unlikely bacterial infectious process.  Patient asymptomatic.  EKG notable for sinus tachycardia without concerning ST elevation/depression, no T wave inversions.  No signs of right heart strain appreciated such as S1/Q3/T3 abnormalities on the EKG. --check TSH/FT4 --start metoprolol tartrate 12.5 mg twice daily  Hypokalemia Hypomagnesemia Potassium 3.3, magnesium 1.6.  Will replete.  Thank you for this consultation.  From a medical standpoint, okay for discharge home with outpatient follow-up when appropriate per OB service.  We will continue to follow along with you while she is hospitalized.  DVT prophylaxis: Lovenox Code Status: Full code Family Communication: Updated family present at bedside  Disposition Plan:  Per OB   Procedures:   None  Antimicrobials:   None   Subjective: Patient seen and examined bedside, resting comfortably.  Family present in room.  No complaints this morning other than wants to know if she can order breakfast.  Denies headache, no dizziness, no chest pain, palpitations, no shortness of breath, no fever/chills/night sweats, no nausea cefonicid diarrhea, no abdominal pain, no weakness, no fatigue, no paresthesias.  No acute events overnight per nursing staff.  Objective: Vitals:   07/22/20 0806 07/22/20 0900 07/22/20 0919 07/22/20 0921  BP: 126/68   123/71  Pulse: (!) 123   (!) 137  Resp: 18     Temp: 99.1 F (37.3 C)  98.8 F (37.1 C)   TempSrc: Oral  Oral   SpO2: 97% 98% 98%   Weight:      Height:       No intake or output data in the 24 hours ending 07/22/20 1006 Filed Weights   07/21/20 1510  Weight: (!) 141.1 kg    Examination:  General exam: Appears calm and comfortable  Respiratory system: Clear to auscultation. Respiratory effort normal. Cardiovascular system: S1 & S2 heard, tachycardic, regular rhythm. No JVD, murmurs, rubs, gallops or clicks. No pedal  edema. Gastrointestinal system: Abdomen is nondistended, soft and nontender. No organomegaly or masses felt. Normal bowel sounds heard. Central nervous system: Alert and oriented. No focal neurological deficits. Extremities: Symmetric 5 x 5 power. Skin: No rashes, lesions or ulcers Psychiatry: Judgement and insight appear normal. Mood & affect appropriate.     Data Reviewed: I have personally reviewed following labs and imaging studies  CBC: Recent Labs  Lab 07/21/20 1530 07/22/20 0638  WBC 6.0 6.1  HGB 11.1* 10.2*  HCT 33.6* 31.3*  MCV 81.2 82.6  PLT 174 295*   Basic Metabolic Panel: Recent Labs  Lab 07/21/20 1530 07/22/20 0638  NA 134* 136  K 3.5 3.3*  CL 103 105  CO2 21* 22  GLUCOSE 96 97  BUN 9 8  CREATININE 0.43* 0.44  CALCIUM 8.5* 8.1*  MG  --  1.6*   GFR: Estimated Creatinine Clearance: 157.8 mL/min (by C-G formula based on SCr of 0.44 mg/dL). Liver Function Tests: Recent Labs  Lab 07/21/20 1530  AST 25  ALT 25  ALKPHOS 129*  BILITOT 0.4  PROT 7.1  ALBUMIN 2.8*   No results for input(s): LIPASE, AMYLASE in the last 168 hours. No results for input(s): AMMONIA in the last 168 hours. Coagulation Profile: No results for input(s): INR, PROTIME in the last 168 hours. Cardiac Enzymes: No results for input(s): CKTOTAL, CKMB, CKMBINDEX, TROPONINI in the last 168 hours. BNP (last 3 results) No results for input(s): PROBNP in the last 8760 hours. HbA1C: No results for input(s): HGBA1C in the last 72 hours. CBG: No results for input(s): GLUCAP in the last 168 hours. Lipid Profile: No results for input(s): CHOL, HDL, LDLCALC, TRIG, CHOLHDL, LDLDIRECT in the last 72 hours. Thyroid Function Tests: No results for input(s): TSH, T4TOTAL, FREET4, T3FREE, THYROIDAB in the last 72 hours. Anemia Panel: No results for input(s): VITAMINB12, FOLATE, FERRITIN, TIBC, IRON, RETICCTPCT in the last 72 hours. Sepsis Labs: No results for input(s): PROCALCITON,  LATICACIDVEN in the last 168 hours.  Recent Results (from the past 240 hour(s))  Respiratory Panel by RT PCR (Flu A&B, Covid) - Nasopharyngeal Swab     Status: Abnormal   Collection Time: 07/21/20  3:07 PM   Specimen: Nasopharyngeal Swab  Result Value Ref Range Status   SARS Coronavirus 2 by RT PCR POSITIVE (A) NEGATIVE Final    Comment: RESULT CALLED TO, READ BACK BY AND VERIFIED WITH: JESSICA LUNSFORD 07/21/20 AT 1604 BY ACR (NOTE) SARS-CoV-2 target nucleic acids are DETECTED.  SARS-CoV-2 RNA is generally detectable in upper respiratory specimens  during the acute phase of infection. Positive results are indicative of the presence of the identified virus, but do not rule out bacterial infection or co-infection with other pathogens not detected by the test. Clinical correlation with patient history and other diagnostic information is necessary to determine patient infection status. The expected result is Negative.  Fact Sheet for Patients:  PinkCheek.be  Fact Sheet for Healthcare Providers: GravelBags.it  This test is not yet approved or cleared by the [place] FDA and  has been authorized for detection and/or diagnosis of SARS-CoV-2 by FDA under an Emergency Use Authorization (EUA).  This EUA will remain in effect (meaning this test ca n be used) for the duration of  the COVID-19 declaration under Section 564(b)(1) of the  Act, 21 U.S.C. section 360bbb-3(b)(1), unless the authorization is terminated or revoked sooner.      Influenza A by PCR NEGATIVE NEGATIVE Final   Influenza B by PCR NEGATIVE NEGATIVE Final    Comment: (NOTE) The Xpert Xpress SARS-CoV-2/FLU/RSV assay is intended as an aid in  the diagnosis of influenza from Nasopharyngeal swab specimens and  should not be used as a sole basis for treatment. Nasal washings and  aspirates are unacceptable for Xpert Xpress SARS-CoV-2/FLU/RSV  testing.  Fact  Sheet for Patients: PinkCheek.be  Fact Sheet for Healthcare Providers: GravelBags.it  This test is not yet approved or cleared by the [place] FDA and  has been authorized for detection and/or diagnosis of SARS-CoV-2 by  FDA under an Emergency Use Authorization (EUA). This EUA will remain  in effect (meaning this test can be used) for the duration of the  Covid-19 declaration under Section 564(b)(1) of the Act, 21  U.S.C. section 360bbb-3(b)(1), unless the authorization is  terminated or revoked. Performed at [facility], [address], [place], [place] [address]          Radiology Studies: [facility] Chest [place] 1 View  Result Date: 07/21/2020 CLINICAL DATA:  Short of breath.  Positive COVID.  Pregnant. EXAM: PORTABLE CHEST 1 VIEW COMPARISON:  None. FINDINGS: Normal mediastinum and cardiac silhouette. Normal pulmonary vasculature. No evidence of effusion, infiltrate, or pneumothorax. No acute bony abnormality. IMPRESSION: No acute cardiopulmonary process. No evidence of pulmonary infection. Electronically Signed   By: Suzy Bouchard M.D.   On: 07/21/2020 20:23        Scheduled Meds: . enoxaparin (LOVENOX) injection  40 mg Subcutaneous Q12H  . oxytocin 40 units in LR 1000 mL  333 mL Intravenous Once  . potassium chloride  30 mEq Oral Q3H   Continuous Infusions: . sodium chloride    . famotidine (PEPCID) IV    . lactated ringers 1,000 mL (07/21/20 1537)  . lactated ringers 125 mL/hr at 07/22/20 9038  . magnesium sulfate bolus IVPB 2 g (07/22/20 0924)  . oxytocin       LOS: 1 day    Time spent: 38 minutes spent on chart review, discussion with nursing staff, consultants, updating family and interview/physical exam; more than 50% of that time was spent in counseling and/or coordination of care.    Chael Urenda J [place] DO [facility] Available via Epic secure chat 7am-7pm After these hours, please  refer to coverage provider listed on [email or web address] 07/22/2020, 10:06 AM

## 2020-07-22 NOTE — Progress Notes (Signed)
Pt given discharge paperwork and follow up discussed. Pt questions answered. Pt taken via wheelchair with SO

## 2020-07-28 ENCOUNTER — Encounter: Payer: Self-pay | Admitting: Obstetrics & Gynecology

## 2020-07-28 ENCOUNTER — Other Ambulatory Visit: Payer: Self-pay

## 2020-07-28 ENCOUNTER — Inpatient Hospital Stay: Payer: Medicaid Other | Admitting: Anesthesiology

## 2020-07-28 ENCOUNTER — Inpatient Hospital Stay
Admission: EM | Admit: 2020-07-28 | Discharge: 2020-07-30 | DRG: 805 | Disposition: A | Discharge status: Home / Self Care | Payer: Medicaid Other | Attending: Obstetrics & Gynecology | Admitting: Obstetrics & Gynecology

## 2020-07-28 DIAGNOSIS — O9081 Anemia of the puerperium: Secondary | ICD-10-CM | POA: Diagnosis not present

## 2020-07-28 DIAGNOSIS — D62 Acute posthemorrhagic anemia: Secondary | ICD-10-CM | POA: Diagnosis not present

## 2020-07-28 DIAGNOSIS — Z3A40 40 weeks gestation of pregnancy: Secondary | ICD-10-CM

## 2020-07-28 DIAGNOSIS — U071 COVID-19: Secondary | ICD-10-CM | POA: Diagnosis present

## 2020-07-28 DIAGNOSIS — O9952 Diseases of the respiratory system complicating childbirth: Secondary | ICD-10-CM | POA: Diagnosis present

## 2020-07-28 DIAGNOSIS — O9852 Other viral diseases complicating childbirth: Secondary | ICD-10-CM | POA: Diagnosis present

## 2020-07-28 DIAGNOSIS — O26893 Other specified pregnancy related conditions, third trimester: Secondary | ICD-10-CM | POA: Diagnosis present

## 2020-07-28 DIAGNOSIS — J45909 Unspecified asthma, uncomplicated: Secondary | ICD-10-CM | POA: Diagnosis present

## 2020-07-28 DIAGNOSIS — O99214 Obesity complicating childbirth: Secondary | ICD-10-CM | POA: Diagnosis present

## 2020-07-28 DIAGNOSIS — O4292 Full-term premature rupture of membranes, unspecified as to length of time between rupture and onset of labor: Principal | ICD-10-CM | POA: Diagnosis present

## 2020-07-28 LAB — COMPREHENSIVE METABOLIC PANEL
ALT: 30 U/L (ref 0–44)
AST: 26 U/L (ref 15–41)
Albumin: 2.5 g/dL — ABNORMAL LOW (ref 3.5–5.0)
Alkaline Phosphatase: 124 U/L (ref 38–126)
Anion gap: 8 (ref 5–15)
BUN: 10 mg/dL (ref 6–20)
CO2: 22 mmol/L (ref 22–32)
Calcium: 8.4 mg/dL — ABNORMAL LOW (ref 8.9–10.3)
Chloride: 106 mmol/L (ref 98–111)
Creatinine, Ser: 0.48 mg/dL (ref 0.44–1.00)
GFR calc non Af Amer: >60 mL/min (ref >60)
Glucose, Bld: 91 mg/dL (ref 70–99)
Potassium: 3.7 mmol/L (ref 3.5–5.1)
Sodium: 136 mmol/L (ref 135–145)
Total Bilirubin: 0.4 mg/dL (ref 0.3–1.2)
Total Protein: 6.4 g/dL — ABNORMAL LOW (ref 6.5–8.1)

## 2020-07-28 LAB — CBC
HCT: 35.6 % — ABNORMAL LOW (ref 36.0–46.0)
Hemoglobin: 11.7 g/dL — ABNORMAL LOW (ref 12.0–15.0)
MCH: 26.7 pg (ref 26.0–34.0)
MCHC: 32.9 g/dL (ref 30.0–36.0)
MCV: 81.1 fL (ref 80.0–100.0)
Platelets: 202 10*3/uL (ref 150–400)
RBC: 4.39 MIL/uL (ref 3.87–5.11)
RDW: 16.1 % — ABNORMAL HIGH (ref 11.5–15.5)
WBC: 7 10*3/uL (ref 4.0–10.5)
nRBC: 0 % (ref 0.0–0.2)

## 2020-07-28 LAB — TYPE AND SCREEN
ABO/RH(D): A POS
Antibody Screen: NEGATIVE

## 2020-07-28 MED ORDER — PHENYLEPHRINE 40 MCG/ML (10ML) SYRINGE FOR IV PUSH (FOR BLOOD PRESSURE SUPPORT): 80.0000 ug | PREFILLED_SYRINGE | INTRAVENOUS | Status: DC | PRN

## 2020-07-28 MED ORDER — FENTANYL 2.5 MCG/ML W/ROPIVACAINE 0.15% IN NS 100 ML EPIDURAL (ARMC)
EPIDURAL | Status: DC | PRN
Start: 2020-07-28 — End: 2020-07-29
  Administered 2020-07-28: 12 mL/h via EPIDURAL

## 2020-07-28 MED ORDER — SOD CITRATE-CITRIC ACID 500-334 MG/5ML PO SOLN: 30.0000 mL | ORAL | Status: DC | PRN

## 2020-07-28 MED ORDER — SODIUM CHLORIDE 0.9 % IV SOLN
INTRAVENOUS | Status: DC | PRN
  Administered 2020-07-28 (×2): 5 mL via EPIDURAL

## 2020-07-28 MED ORDER — AMMONIA AROMATIC IN INHA
RESPIRATORY_TRACT | Status: AC
  Filled 2020-07-28: qty 10

## 2020-07-28 MED ORDER — LACTATED RINGERS IV SOLN: 500.0000 mL | INTRAVENOUS | Status: DC | PRN

## 2020-07-28 MED ORDER — FENTANYL 2.5 MCG/ML W/ROPIVACAINE 0.15% IN NS 100 ML EPIDURAL (ARMC): 12.0000 mL/h | EPIDURAL | Status: DC

## 2020-07-28 MED ORDER — LIDOCAINE HCL (PF) 1 % IJ SOLN
INTRAMUSCULAR | Status: AC
  Filled 2020-07-28: qty 30

## 2020-07-28 MED ORDER — ACETAMINOPHEN 325 MG PO TABS: 650.0000 mg | ORAL_TABLET | ORAL | Status: DC | PRN

## 2020-07-28 MED ORDER — TERBUTALINE SULFATE 1 MG/ML IJ SOLN: 0.2500 mg | Freq: Once | INTRAMUSCULAR | Status: DC | PRN

## 2020-07-28 MED ORDER — LIDOCAINE-EPINEPHRINE (PF) 1.5 %-1:200000 IJ SOLN
INTRAMUSCULAR | Status: DC | PRN
  Administered 2020-07-28: 3 mL via EPIDURAL

## 2020-07-28 MED ORDER — FENTANYL 2.5 MCG/ML W/ROPIVACAINE 0.15% IN NS 100 ML EPIDURAL (ARMC)
EPIDURAL | Status: AC
  Filled 2020-07-28: qty 100

## 2020-07-28 MED ORDER — ONDANSETRON HCL 4 MG/2ML IJ SOLN: 4.0000 mg | Freq: Four times a day (QID) | INTRAMUSCULAR | Status: DC | PRN

## 2020-07-28 MED ORDER — OXYTOCIN BOLUS FROM INFUSION
333.0000 mL | Freq: Once | INTRAVENOUS | Status: AC
  Administered 2020-07-29: 333 mL via INTRAVENOUS

## 2020-07-28 MED ORDER — MISOPROSTOL 200 MCG PO TABS
ORAL_TABLET | ORAL | Status: AC
  Administered 2020-07-29: 800 ug
  Filled 2020-07-28: qty 4

## 2020-07-28 MED ORDER — EPHEDRINE 5 MG/ML INJ: 10.0000 mg | INTRAVENOUS | Status: DC | PRN

## 2020-07-28 MED ORDER — LACTATED RINGERS IV SOLN: 500.0000 mL | Freq: Once | INTRAVENOUS | Status: DC

## 2020-07-28 MED ORDER — LACTATED RINGERS IV SOLN: INTRAVENOUS | Status: DC

## 2020-07-28 MED ORDER — DIPHENHYDRAMINE HCL 50 MG/ML IJ SOLN: 12.5000 mg | INTRAMUSCULAR | Status: DC | PRN

## 2020-07-28 MED ORDER — OXYTOCIN-SODIUM CHLORIDE 30-0.9 UT/500ML-% IV SOLN
2.5000 [IU]/h | INTRAVENOUS | Status: DC
  Administered 2020-07-29 (×2): 2.5 [IU]/h via INTRAVENOUS
  Filled 2020-07-28: qty 1000

## 2020-07-28 MED ORDER — FENTANYL CITRATE (PF) 100 MCG/2ML IJ SOLN
50.0000 ug | INTRAMUSCULAR | Status: DC | PRN
  Administered 2020-07-28: 100 ug via INTRAVENOUS
  Filled 2020-07-28: qty 2

## 2020-07-28 MED ORDER — LIDOCAINE HCL (PF) 1 % IJ SOLN: 30.0000 mL | INTRAMUSCULAR | Status: DC | PRN

## 2020-07-28 MED ORDER — OXYTOCIN 10 UNIT/ML IJ SOLN
INTRAMUSCULAR | Status: AC
  Filled 2020-07-28: qty 2

## 2020-07-28 MED ORDER — LIDOCAINE HCL (PF) 1 % IJ SOLN
INTRAMUSCULAR | Status: DC | PRN
  Administered 2020-07-28: 3 mL via SUBCUTANEOUS

## 2020-07-28 MED ORDER — OXYTOCIN-SODIUM CHLORIDE 30-0.9 UT/500ML-% IV SOLN
1.0000 m[IU]/min | INTRAVENOUS | Status: DC
  Administered 2020-07-28: 2 m[IU]/min via INTRAVENOUS

## 2020-07-28 NOTE — Progress Notes (Signed)
Pt reports gush of clear fluid around 1500. Continues to have leaking fluid. Mostly clear with a little bit of blood. +FM. Reports no ctx. Pt tested positive for covid last Wednesday.

## 2020-07-28 NOTE — Anesthesia Preprocedure Evaluation (Signed)
Anesthesia Evaluation  Patient identified by MRN, date of birth, ID band Patient awake    Reviewed: Allergy & Precautions, H&P , NPO status , Patient's Chart, lab work & pertinent test results  History of Anesthesia Complications Negative for: history of anesthetic complications  Airway Mallampati: III  TM Distance: >3 FB Neck ROM: full    Dental no notable dental hx. (+) Teeth Intact   Pulmonary asthma , Recent URI  (Covid positive),           Cardiovascular Exercise Tolerance: Good negative cardio ROS       Neuro/Psych PSYCHIATRIC DISORDERS Anxiety Depression    GI/Hepatic negative GI ROS,   Endo/Other  neg diabetesMorbid obesity  Renal/GU   negative genitourinary   Musculoskeletal   Abdominal   Peds  Hematology negative hematology ROS (+)   Anesthesia Other Findings Past Medical History: No date: Asthma No date: Vitamin D deficiency  Past Surgical History: No date: NO PAST SURGERIES  BMI    Body Mass Index:  46.53 kg/m      Reproductive/Obstetrics (+) Pregnancy                             Anesthesia Physical  Anesthesia Plan  ASA: III  Anesthesia Plan: Epidural   Post-op Pain Management:    Induction:   PONV Risk Score and Plan:   Airway Management Planned:   Additional Equipment:   Intra-op Plan:   Post-operative Plan:   Informed Consent: I have reviewed the patients History and Physical, chart, labs and discussed the procedure including the risks, benefits and alternatives for the proposed anesthesia with the patient or authorized representative who has indicated his/her understanding and acceptance.       Plan Discussed with: Anesthesiologist  Anesthesia Plan Comments:         Anesthesia Quick Evaluation

## 2020-07-28 NOTE — Anesthesia Procedure Notes (Signed)
Epidural Patient location during procedure: OB Start time: 07/28/2020 10:57 PM End time: 07/28/2020 11:02 PM  Staffing Anesthesiologist: Lenard Simmer, MD Performed: anesthesiologist   Preanesthetic Checklist Completed: patient identified, IV checked, site marked, risks and benefits discussed, surgical consent, monitors and equipment checked, pre-op evaluation and timeout performed  Epidural Patient position: sitting Prep: ChloraPrep Patient monitoring: heart rate, continuous pulse ox and blood pressure Approach: midline Location: L3-L4 Injection technique: LOR saline  Needle:  Needle type: Tuohy  Needle gauge: 17 G Needle length: 9 cm and 9 Needle insertion depth: 7 cm Catheter type: closed end flexible Catheter size: 19 Gauge Catheter at skin depth: 12 cm Test dose: negative and 1.5% lidocaine with Epi 1:200 K  Assessment Sensory level: T10 Events: blood not aspirated, injection not painful, no injection resistance, no paresthesia and negative IV test  Additional Notes 1st attempt Pt. Evaluated and documentation done after procedure finished. Patient identified. Risks/Benefits/Options discussed with patient including but not limited to bleeding, infection, nerve damage, paralysis, failed block, incomplete pain control, headache, blood pressure changes, nausea, vomiting, reactions to medication both or allergic, itching and postpartum back pain. Confirmed with bedside nurse the patient's most recent platelet count. Confirmed with patient that they are not currently taking any anticoagulation, have any bleeding history or any family history of bleeding disorders. Patient expressed understanding and wished to proceed. All questions were answered. Sterile technique was used throughout the entire procedure. Please see nursing notes for vital signs. Test dose was given through epidural catheter and negative prior to continuing to dose epidural or start infusion. Warning signs of high  block given to the patient including shortness of breath, tingling/numbness in hands, complete motor block, or any concerning symptoms with instructions to call for help. Patient was given instructions on fall risk and not to get out of bed. All questions and concerns addressed with instructions to call with any issues or inadequate analgesia.   Patient tolerated the insertion well without immediate complications.Reason for block:procedure for pain

## 2020-07-28 NOTE — Progress Notes (Signed)
Intrapartum progress note:  S: patient complaining of pain with contractions.  No respiratory complaints, no other concerns.  O: BP 105/76   Pulse 77   Temp 98.2 F (36.8 C) (Oral)   Resp 16   Ht 5\' 8"  (1.727 m)   Wt (!) 142 kg   SpO2 97%   BMI 47.60 kg/m   SVE: 6 / 80 / -2 FHR 150 mod + accels no decels TOCO: q78min, pit @ 12  A/P: 24yo G2P1 @ 40+1 with PROM  1. IOL: pitocin infusing, regular contractions, cervical change.  Continue pitocin  2. IUP: category 1 tracing 3. Pain control: s/p 1 dose fentanyl with minimum effect, patient requesting epidural. 4. COVID: asymptomatic, continue protocol 5. Anticipate vaginal delivery.  ----- 3m, MD, FACOG Attending Obstetrician and Gynecologist [facility] Department of OB/GYN [facility]

## 2020-07-28 NOTE — H&P (Signed)
OB History & Physical   History of Present Illness:  Chief Complaint: water broke around 3pm  HPI:  Brianna Grimes is a 24 y.o. G2P1001 female at [redacted]w[redacted]d dated by [place] at [redacted]w[redacted]d.  She presents to L&D for large gush of clear fluid at 1500, denies VB or contractions. Reports active FM.    Pregnancy Issues: 1. Obesity, BMI >40 2. Hx Prediabetes dx 2009, NOB A1C 5.3 3. Close interval pregnancy 4. Vitamin D deficiency 5. Anxiety, declines meds, considering counseling 6. 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 COVID positive on admission 8. History of asthma   Maternal Medical History:   Past Medical History:  Diagnosis Date  . Asthma   . Vitamin D deficiency     Past Surgical History:  Procedure Laterality Date  . NO PAST SURGERIES      No Known Allergies  Prior to Admission medications   Medication Sig Start Date End Date Taking? Authorizing Provider  ergocalciferol (VITAMIN D2) 1.25 MG (50000 UT) capsule Take 50,000 Units by mouth once a week. 07/07/20  Yes [provider]  Prenatal Vit-Fe Fumarate-FA (MULTIVITAMIN-PRENATAL) 27-0.8 MG TABS tablet Take 1 tablet by mouth daily at 12 noon.   Yes [provider]  acetaminophen (TYLENOL) 325 MG tablet Take 2 tablets (650 mg total) by mouth every 4 (four) hours as needed for mild pain, moderate pain or headache. 10/01/18   Genia Del, CNM  albuterol (VENTOLIN HFA) 108 (90 Base) MCG/ACT inhaler Inhale 2 puffs into the lungs every 4 (four) hours as needed for wheezing. 03/06/18 07/07/21  [provider]     Prenatal care site: [facility]  Social History: She  reports that she has never smoked. She has never used smokeless tobacco. She reports previous drug use. She reports that she does not drink alcohol.  Family History: family history includes Bipolar disorder in her father, mother, and paternal grandmother; Breast cancer in her maternal aunt and maternal aunt; Diabetes in her  maternal aunt and mother; Hypertension in her brother and father.   Review of Systems: A full review of systems was performed and negative except as noted in the HPI.     Physical Exam:  Vital Signs: BP 123/69 (BP Location: Left Arm)   Pulse 97   Temp 97.9 F (36.6 C) (Oral)   Resp 17   Ht 5\' 8"  (1.727 m)   Wt (!) 142 kg   SpO2 96%   BMI 47.60 kg/m  General: no acute distress.  HEENT: normocephalic, atraumatic Heart: regular rate & rhythm.  No murmurs/rubs/gallops Lungs: clear to auscultation bilaterally, normal respiratory effort Abdomen: soft, gravid, non-tender;  EFW: 7.8lbs Pelvic:   External: Normal external female genitalia  Cervix: Dilation: 4 / Effacement (%): 60 / Station: -3    Extremities: non-tender, symmetric, 1+ edema bilaterally.  DTRs: 2+  Neurologic: Alert & oriented x 3.    No results found for this or any previous visit (from the past 24 hour(s)).  Pertinent Results:  Prenatal Labs: Blood type/Rh A Pos  Antibody screen neg  Rubella Immune  Varicella Immune  RPR NR  HBsAg Neg  HIV NR  GC neg  Chlamydia neg  Genetic screening negative  1 hour GTT  141  3 hour GTT  [id number]  GBS  Negative   FHT: 150bpm, min variability, + accels, no decels TOCO: uterine irritability SVE:  Dilation: 4 / Effacement (%): 60 / Station: -3    Cephalic by SVE  DG Chest  Port 1 View  Result Date: 07/21/2020 CLINICAL DATA:  Short of breath.  Positive COVID.  Pregnant. EXAM: PORTABLE CHEST 1 VIEW COMPARISON:  None. FINDINGS: Normal mediastinum and cardiac silhouette. Normal pulmonary vasculature. No evidence of effusion, infiltrate, or pneumothorax. No acute bony abnormality. IMPRESSION: No acute cardiopulmonary process. No evidence of pulmonary infection. Electronically Signed   By: Genevive Bi M.D.   On: 07/21/2020 20:23    Assessment:  Brianna Grimes is a 24 y.o. G2P1001 female at [redacted]w[redacted]d with PROM.   Plan:  1. Admit to Labor & Delivery; consents  reviewed and obtained - COVID positive, admitted last week and given M-Abs, feeling better and asymptomatic now. Continue contact and airborne precautions.   2. Fetal Well being  - Fetal Tracing: Cat I with periods of minimal variability - Group B Streptococcus ppx indicated: negative - Presentation: cephalic confirmed by SVE   3. Routine OB: - Prenatal labs reviewed, as above - Rh A Pos - CBC, T&S, RPR on admit - Clear fluids, IVF  4. Monitoring of Labor -  Contractions: external toco in place -  Pelvis proven to 8#4 -  Plan for augmentation with Pitocin  -  Plan for continuous fetal monitoring  -  Maternal pain control as desired - Anticipate vaginal delivery  5. Post Partum Planning: - Infant feeding: breast - Contraception: IUD - Tdap: 05/07/20 - flu: 06/24/20  Randa Ngo, CNM 07/28/20 4:46 PM  ----- Ranae Plumber, MD, FACOG Attending Obstetrician and Gynecologist [facility], Department of OB/GYN [facility]

## 2020-07-29 ENCOUNTER — Encounter: Payer: Self-pay | Admitting: Obstetrics & Gynecology

## 2020-07-29 LAB — CBC
HCT: 29.7 % — ABNORMAL LOW (ref 36.0–46.0)
Hemoglobin: 9.8 g/dL — ABNORMAL LOW (ref 12.0–15.0)
MCH: 26.6 pg (ref 26.0–34.0)
MCHC: 33 g/dL (ref 30.0–36.0)
MCV: 80.5 fL (ref 80.0–100.0)
Platelets: 191 10*3/uL (ref 150–400)
RBC: 3.69 MIL/uL — ABNORMAL LOW (ref 3.87–5.11)
RDW: 16.2 % — ABNORMAL HIGH (ref 11.5–15.5)
WBC: 9.9 10*3/uL (ref 4.0–10.5)
nRBC: 0 % (ref 0.0–0.2)

## 2020-07-29 LAB — RPR: RPR Ser Ql: NONREACTIVE

## 2020-07-29 MED ORDER — WITCH HAZEL-GLYCERIN EX PADS
1.0000 "application " | MEDICATED_PAD | CUTANEOUS | Status: DC
  Administered 2020-07-29: 1 via TOPICAL
  Filled 2020-07-29: qty 100

## 2020-07-29 MED ORDER — ENOXAPARIN SODIUM 40 MG/0.4ML ~~LOC~~ SOLN
40.0000 mg | SUBCUTANEOUS | Status: DC
  Administered 2020-07-29: 40 mg via SUBCUTANEOUS
  Filled 2020-07-29: qty 0.4

## 2020-07-29 MED ORDER — DIBUCAINE (PERIANAL) 1 % EX OINT: 1.0000 "application " | TOPICAL_OINTMENT | CUTANEOUS | Status: DC | PRN

## 2020-07-29 MED ORDER — ONDANSETRON HCL 4 MG PO TABS
4.0000 mg | ORAL_TABLET | ORAL | Status: DC | PRN
  Filled 2020-07-29: qty 1

## 2020-07-29 MED ORDER — SIMETHICONE 80 MG PO CHEW: 80.0000 mg | CHEWABLE_TABLET | ORAL | Status: DC | PRN

## 2020-07-29 MED ORDER — ACETAMINOPHEN 500 MG PO TABS: 1000.0000 mg | ORAL_TABLET | Freq: Four times a day (QID) | ORAL | Status: DC | PRN

## 2020-07-29 MED ORDER — DOCUSATE SODIUM 100 MG PO CAPS
100.0000 mg | ORAL_CAPSULE | Freq: Two times a day (BID) | ORAL | Status: DC
  Filled 2020-07-29 (×2): qty 1

## 2020-07-29 MED ORDER — IBUPROFEN 600 MG PO TABS
600.0000 mg | ORAL_TABLET | Freq: Four times a day (QID) | ORAL | Status: DC
  Administered 2020-07-29 – 2020-07-30 (×5): 600 mg via ORAL
  Filled 2020-07-29 (×5): qty 1

## 2020-07-29 MED ORDER — COCONUT OIL OIL
1.0000 "application " | TOPICAL_OIL | Status: DC | PRN
  Administered 2020-07-29: 1 via TOPICAL
  Filled 2020-07-29: qty 120

## 2020-07-29 MED ORDER — ONDANSETRON HCL 4 MG/2ML IJ SOLN: 4.0000 mg | INTRAMUSCULAR | Status: DC | PRN

## 2020-07-29 MED ORDER — FERROUS SULFATE 325 (65 FE) MG PO TABS
325.0000 mg | ORAL_TABLET | Freq: Two times a day (BID) | ORAL | Status: DC
  Administered 2020-07-29 – 2020-07-30 (×2): 325 mg via ORAL
  Filled 2020-07-29 (×2): qty 1

## 2020-07-29 MED ORDER — DIPHENHYDRAMINE HCL 25 MG PO CAPS: 25.0000 mg | ORAL_CAPSULE | Freq: Four times a day (QID) | ORAL | Status: DC | PRN

## 2020-07-29 MED ORDER — PRENATAL MULTIVITAMIN CH
1.0000 | ORAL_TABLET | Freq: Every day | ORAL | Status: DC
  Administered 2020-07-29: 1 via ORAL
  Filled 2020-07-29: qty 1

## 2020-07-29 MED ORDER — BENZOCAINE-MENTHOL 20-0.5 % EX AERO
1.0000 "application " | INHALATION_SPRAY | CUTANEOUS | Status: DC | PRN
  Administered 2020-07-29: 1 via TOPICAL
  Filled 2020-07-29: qty 56

## 2020-07-29 NOTE — Progress Notes (Signed)
Patient's continuous EFM difficult to trace due to patient's gravid status and movement in bed due to constant pressure and feeling though she needs to push. I, RN at bedside attempting to find best position for [facility] (1-Rh) and [place] despite current situation. Ward, MD made aware and is coming to attend delivery.

## 2020-07-29 NOTE — Plan of Care (Signed)

## 2020-07-29 NOTE — Progress Notes (Signed)
Post Partum Day 0  Subjective: no complaints, up ad lib and voiding  Doing well, no concerns. Ambulating without difficulty, pain managed with PO meds, tolerating regular diet, and voiding without difficulty.   No fever/chills, chest pain, shortness of breath, nausea/vomiting, or leg pain. No nipple or breast pain. No headache, visual changes, or RUQ/epigastric pain.  Objective: BP 138/62 (BP Location: Left Arm)   Pulse 84   Temp 98.3 F (36.8 C) (Oral)   Resp 16   Ht 5\' 8"  (1.727 m)   Wt (!) 141.1 kg   LMP 10/21/2019   SpO2 95%   Breastfeeding Unknown   BMI 47.29 kg/m    Physical Exam:  General: alert, cooperative and no distress Breasts: soft/nontender CV: RRR Pulm: nl effort, CTABL Abdomen: soft, non-tender, active bowel sounds Uterine Fundus: firm Perineum: minimal edema, intact Lochia: appropriate DVT Evaluation: No evidence of DVT seen on physical exam.  Recent Labs    07/28/20 1713 07/29/20 0731  HGB 11.7* 9.8*  HCT 35.6* 29.7*  WBC 7.0 9.9  PLT 202 191    Assessment/Plan: 24 y.o. G2P2002 postpartum day # 0  -Continue routine postpartum care -Lactation consult PRN for breastfeeding, currently pumping while infant in SCN  -Discussed contraceptive options including implant, IUDs hormonal and non-hormonal, injection, pills/ring/patch, condoms, and NFP. Considering IUD -Acute blood loss anemia - hemodynamically stable and asymptomatic; start PO ferrous sulfate BID with stool softeners  -Immunization status: all immunizations up to date  Disposition: Continue inpatient postpartum care    LOS: 1 day   09/28/20, CNM 07/29/2020, 9:13 AM   ----- 09/28/2020  Certified Nurse Midwife [facility]

## 2020-07-29 NOTE — Discharge Summary (Signed)
Obstetrical Discharge Summary  Patient Name: Brianna Grimes DOB: Oct 11, 1996 MRN: [id number]  Date of Admission: 07/28/2020 Date of Delivery: 07/29/2020 Delivered by: Ranae Plumber, MD Date of Discharge: 07/30/2020  Primary OB: [facility]  BJS:[id number] last menstrual period was 10/21/2019. EDC Estimated Date of Delivery: 07/27/20 Gestational Age at Delivery: [redacted]w[redacted]d   Antepartum complications:  1. Obesity, BMI >40 2. Hx Prediabetes dx 2009, NOB A1C 5.3 3. Close interval pregnancy 4. Vitamin D deficiency 5. Anxiety, declines meds, considering counseling 6. 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 COVID positive on admission 8. History of asthma  Admitting Diagnosis: PROM  Secondary Diagnosis: Patient Active Problem List   Diagnosis Date Noted  . Labor and delivery, indication for care 07/28/2020  . Sinus tachycardia 07/22/2020  . COVID-19 virus infection 07/22/2020  . Maternal care for fetal tachycardia during pregnancy 07/21/2020  . Anxiety 12/20/2018  . Episode of recurrent major depressive disorder (HCC) 12/20/2018  . Encounter for supervision of normal pregnancy in multigravida 09/19/2018  . 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 BMI (body mass index), pediatric, greater than 99% for age 27/25/2014  . 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 Vitamin D insufficiency 02/14/2013    Augmentation: Pitocin Complications: None Intrapartum complications/course: Mom presented to L&D with PROM, augmented with pitocin.  epidual placed. Progressed to complete, second stage: 2 contractions.  delivery of fetal head with restitution to ROT.   Anterior then posterior shoulders delivered without difficulty.  Baby placed on mom's chest, and attended to by peds. Cord was then clamped and cut when pulseless.  Placenta spontaneously delivered, intact.   IV pitocin given for hemorrhage prophylaxis.  Minimal blood loss at time of delivery (10cc). Shortly after delivery, several  clots were expressed, cytotec was placed rectally.  Total loss 2hrs after delivery was 530cc.  Delivery Type: spontaneous vaginal delivery Anesthesia: epidural Placenta: spontaneous Laceration: none Episiotomy: none Newborn Data: Live born female  Birth Weight: 9 lb 8 oz (4310 g) APGAR: 8, 9  Newborn Delivery   Birth date/time: 07/29/2020 00:55:00 Delivery type: Vaginal, Spontaneous      Postpartum Procedures: none  Edinburgh:  Edinburgh Postnatal Depression Scale Screening Tool 07/29/2020  I have been able to laugh and see the funny side of things. 0  I have looked forward with enjoyment to things. 0  I have blamed myself unnecessarily when things went wrong. 0  I have been anxious or worried for no good reason. 0  I have felt scared or panicky for no good reason. 0  Things have been getting on top of me. 1  I have been so unhappy that I have had difficulty sleeping. 0  I have felt sad or miserable. 0  I have been so unhappy that I have been crying. 0  The thought of harming myself has occurred to me. 0  Edinburgh Postnatal Depression Scale Total 1  Some encounter information is confidential and restricted. Go to Review Flowsheets activity to see all data.     Post partum course:  Patient had an uncomplicated postpartum course.  By time of discharge on PPD#1, her pain was controlled on oral pain medications; she had appropriate lochia and was ambulating, voiding without difficulty and tolerating regular diet.  She was deemed stable for discharge to home.     Discharge Physical Exam:  BP (!) 103/59   Pulse 76   Temp 98.6 F (37 C) (Oral)   Resp 16   Ht 5\' 8"  (1.727 m)   Wt )  141.1 kg   LMP 10/21/2019   SpO2 98%   Breastfeeding Unknown   BMI 47.29 kg/m   General: NAD CV: RRR Pulm: CTABL, nl effort ABD: s/nd/nt, fundus firm and below the umbilicus Lochia: moderate DVT Evaluation: LE non-ttp, no evidence of DVT on exam.  Hemoglobin  Date Value Ref  Range Status  07/29/2020 9.8 (L) 12.0 - 15.0 g/dL Final   HCT  Date Value Ref Range Status  07/29/2020 29.7 (L) 36 - 46 % Final     Disposition: stable, discharge to home. Baby Feeding: formula Baby Disposition: SCN  Rh Immune globulin given: n/a Rubella vaccine given:  n/a Flu vaccine given in AP or PP setting:  AP  Contraception: IUD  Prenatal Labs:   Blood type/Rh A Pos  Antibody screen neg  Rubella Immune  Varicella Immune  RPR NR  HBsAg Neg  HIV NR  GC neg  Chlamydia neg  Genetic screening negative  1 hour GTT  141  3 hour GTT  [id number]  GBS  Negative     Plan:  Brianna Grimes was discharged to home in good condition. Follow-up appointment with Dr. Elesa Massed in 6 weeks.  Discharge Medications: Allergies as of 07/30/2020   No Known Allergies     Medication List    TAKE these medications   acetaminophen 325 MG tablet Commonly known as: Tylenol Take 2 tablets (650 mg total) by mouth every 4 (four) hours as needed for mild pain, moderate pain or headache.   albuterol 108 (90 Base) MCG/ACT inhaler Commonly known as: VENTOLIN HFA Inhale 2 puffs into the lungs every 4 (four) hours as needed for wheezing.   ergocalciferol 1.25 MG (50000 UT) capsule Commonly known as: VITAMIN D2 Take 50,000 Units by mouth once a week.   ferrous sulfate 325 (65 FE) MG tablet Take 1 tablet (325 mg total) by mouth daily with breakfast.   ibuprofen 600 MG tablet Commonly known as: ADVIL Take 1 tablet (600 mg total) by mouth every 6 (six) hours as needed for mild pain, moderate pain or cramping.   multivitamin-prenatal 27-0.8 MG Tabs tablet Take 1 tablet by mouth daily at 12 noon.        Follow-up Information    Ward, Elenora Fender, MD Follow up in 6 week(s).   Specialty: Obstetrics and Gynecology Why: postpartum check and IUD insertion Contact information: [address] [place] [address] [phone]               Signed: Genia Del, CNM 07/30/2020 8:15 AM

## 2020-07-30 ENCOUNTER — Other Ambulatory Visit: Payer: Self-pay | Admitting: Obstetrics and Gynecology

## 2020-07-30 MED ORDER — IBUPROFEN 600 MG PO TABS: 600.0000 mg | ORAL_TABLET | Freq: Four times a day (QID) | ORAL | 0 refills | Status: DC | PRN

## 2020-07-30 MED ORDER — FERROUS SULFATE 325 (65 FE) MG PO TABS: 325.0000 mg | ORAL_TABLET | Freq: Every day | ORAL | 0 refills | Status: DC

## 2020-07-30 NOTE — Progress Notes (Signed)
Per infection prevention, as long as pt is asymptomatic, pt okay to be off quarantine tomorrow 07/31/2020 and is okay to visit her baby then.

## 2020-07-30 NOTE — Discharge Instructions (Signed)
After Your Delivery Discharge Instructions   Postpartum: Care Instructions  After childbirth (postpartum period), your body goes through many changes. Some of these changes happen over several weeks. In the hours after delivery, your body will begin to recover from childbirth while it prepares to breastfeed your newborn. You may feel emotional during this time. Your hormones can shift your mood without warning for no clear reason.  In the first couple of weeks after childbirth, many women have emotions that change from happy to sad. You may find it hard to sleep. You may cry a lot. This is called the "baby blues." These overwhelming emotions often go away within a couple of days or weeks. But it's important to discuss your feelings with your doctor.  You should call your care provider if you have unrelieved feelings of:  Inability to cope  Sadness  Anxiety  Lack of interest in baby  Insomnia  Crying  It is easy to get too tired and overwhelmed during the first weeks after childbirth. Don't try to do too much. Get rest whenever you can, accept help from others, and eat well and drink plenty of fluids.  About 4 to 6 weeks after your baby's birth, you will have a follow-up visit with your care provider. This visit is your time to talk to your provider about anything you are concerned or curious about.  Follow-up care is a key part of your treatment and safety. Be sure to make and go to all appointments, and call your doctor if you are having problems. It's also a good idea to know your test results and keep a list of the medicines you take.  How can you care for yourself at home?  Sleep or rest when your baby sleeps.  Get help with household chores from family or friends, if you can. Do not try to do it all yourself.  If you have hemorrhoids or swelling or pain around the opening of your vagina, try using cold and heat. You can put ice or a cold pack on the area for 10 to 20 minutes at  a time. Put a thin cloth between the ice and your skin. Also try sitting in a few inches of warm water (sitz bath) 3 times a day and after bowel movements.  Take pain medicines exactly as directed.  If the provider gave you a prescription medicine for pain, take it as prescribed.  If you do not have a prescription and need something over the counter, you can take:  Ibuprofen (Motrin, Advil) up to 600mg every 6 hours as needed for pain  Acetaminophen (Tylenol) up to 650mg every 4 hours as needed for pain  Some people find it helpful to alternate between these two medications.   No driving for 1-2 weeks or while taking pain medications.   Eat more fiber to avoid constipation. Include foods such as whole-grain breads and cereals, raw vegetables, raw and dried fruits, and beans.  Drink plenty of fluids, enough so that your urine is light yellow or clear like water. If you have kidney, heart, or liver disease and have to limit fluids, talk with your doctor before you increase the amount of fluids you drink.  Do not put anything in the vagina for 6 weeks. This means no sex, no tampons, no douching, and no enemas.  If you have stitches, keep the area clean by pouring or spraying warm water over the area outside your vagina and anus after you use the toilet.    No strenuous activity or heavy lifting for 6 weeks   No tub baths; showers only  Continue prenatal vitamin and iron.  If breastfeeding:  Increase calories and fluids while breastfeeding.  You may have a slight fever when your milk comes in, but it should go away on its own. If it does not, and rises above 101.0 please call the doctor.  For breastfeeding concerns, the lactation consultant can be reached at [phone].  For concerns about your baby, please call your pediatrician.   Keep a list of questions to bring to your postpartum visit. Your questions might be about:  Changes in your breasts, such as lumps or  soreness.  When to expect your menstrual period to start again.  What form of birth control is best for you.  Weight you have put on during the pregnancy.  Exercise options.  What foods and drinks are best for you, especially if you are breastfeeding.  Problems you might be having with breastfeeding.  When you can have sex. Some women may want to talk about lubricants for the vagina.  Any feelings of sadness or restlessness that you are having.   When should you call for help?  Call 911 anytime you think you may need emergency care. For example, call if:  You have thoughts of harming yourself, your baby, or another person.  You passed out (lost consciousness).  Call the office at [phone] or seek immediate medical care if:  If you have heavy bleeding such that you are soaking 1 pad in an hour for 2 hours  You are dizzy or lightheaded, or you feel like you may faint.  You have a fever; a temperature of 101.0 F or greater  Chills  Difficulty urinating  Headache unrelieved by "pain meds"   Visual changes  Pain in the right side of your belly near your ribs  Breasts reddened, hard, hot to the touch or any other breast concerns  Nipple discharge which is foul-smelling or contains pus   New pain unrelieved with recommended over-the-counter dosages  Difficulty breathing with or without chest pain   New leg pain, swelling, or redness, especially if it is only on one leg  Any other concerns  Watch closely for changes in your health, and be sure to contact your provider if:  You have new or worse vaginal discharge.  You feel sad or depressed.  You are having problems with your breasts or breastfeeding.   Postpartum Care After Vaginal Delivery This sheet gives you information about how to care for yourself from the time you deliver your baby to up to 6-12 weeks after delivery (postpartum period). Your health care provider may also give you more specific  instructions. If you have problems or questions, contact your health care provider. Follow these instructions at home: Vaginal bleeding  It is normal to have vaginal bleeding (lochia) after delivery. Wear a sanitary pad for vaginal bleeding and discharge. ? During the first week after delivery, the amount and appearance of lochia is often similar to a menstrual period. ? Over the next few weeks, it will gradually decrease to a dry, yellow-brown discharge. ? For most women, lochia stops completely by 4-6 weeks after delivery. Vaginal bleeding can vary from woman to woman.  Change your sanitary pads frequently. Watch for any changes in your flow, such as: ? A sudden increase in volume. ? A change in color. ? Large blood clots.  If you pass a blood clot from your vagina, save  it and call your health care provider to discuss. Do not flush blood clots down the toilet before talking with your health care provider.  Do not use tampons or douches until your health care provider says this is safe.  If you are not breastfeeding, your period should return 6-8 weeks after delivery. If you are feeding your child breast milk only (exclusive breastfeeding), your period may not return until you stop breastfeeding. Perineal care  Keep the area between the vagina and the anus (perineum) clean and dry as told by your health care provider. Use medicated pads and pain-relieving sprays and creams as directed.  If you had a cut in the perineum (episiotomy) or a tear in the vagina, check the area for signs of infection until you are healed. Check for: ? More redness, swelling, or pain. ? Fluid or blood coming from the cut or tear. ? Warmth. ? Pus or a bad smell.  You may be given a squirt bottle to use instead of wiping to clean the perineum area after you go to the bathroom. As you start healing, you may use the squirt bottle before wiping yourself. Make sure to wipe gently.  To relieve pain caused by an  episiotomy, a tear in the vagina, or swollen veins in the anus (hemorrhoids), try taking a warm sitz bath 2-3 times a day. A sitz bath is a warm water bath that is taken while you are sitting down. The water should only come up to your hips and should cover your buttocks. Breast care  Within the first few days after delivery, your breasts may feel heavy, full, and uncomfortable (breast engorgement). Milk may also leak from your breasts. Your health care provider can suggest ways to help relieve the discomfort. Breast engorgement should go away within a few days.  If you are breastfeeding: ? Wear a bra that supports your breasts and fits you well. ? Keep your nipples clean and dry. Apply creams and ointments as told by your health care provider. ? You may need to use breast pads to absorb milk that leaks from your breasts. ? You may have uterine contractions every time you breastfeed for up to several weeks after delivery. Uterine contractions help your uterus return to its normal size. ? If you have any problems with breastfeeding, work with your health care provider or Science writer.  If you are not breastfeeding: ? Avoid touching your breasts a lot. Doing this can make your breasts produce more milk. ? Wear a good-fitting bra and use cold packs to help with swelling. ? Do not squeeze out (express) milk. This causes you to make more milk. Intimacy and sexuality  Ask your health care provider when you can engage in sexual activity. This may depend on: ? Your risk of infection. ? How fast you are healing. ? Your comfort and desire to engage in sexual activity.  You are able to get pregnant after delivery, even if you have not had your period. If desired, talk with your health care provider about methods of birth control (contraception). Medicines  Take over-the-counter and prescription medicines only as told by your health care provider.  If you were prescribed an antibiotic  medicine, take it as told by your health care provider. Do not stop taking the antibiotic even if you start to feel better. Activity  Gradually return to your normal activities as told by your health care provider. Ask your health care provider what activities are safe for you.  Rest as much as possible. Try to rest or take a nap while your baby is sleeping. Eating and drinking   Drink enough fluid to keep your urine pale yellow.  Eat high-fiber foods every day. These may help prevent or relieve constipation. High-fiber foods include: ? Whole grain cereals and breads. ? Brown rice. ? Beans. ? Fresh fruits and vegetables.  Do not try to lose weight quickly by cutting back on calories.  Take your prenatal vitamins until your postpartum checkup or until your health care provider tells you it is okay to stop. Lifestyle  Do not use any products that contain nicotine or tobacco, such as cigarettes and e-cigarettes. If you need help quitting, ask your health care provider.  Do not drink alcohol, especially if you are breastfeeding. General instructions  Keep all follow-up visits for you and your baby as told by your health care provider. Most women visit their health care provider for a postpartum checkup within the first 3-6 weeks after delivery. Contact a health care provider if:  You feel unable to cope with the changes that your child brings to your life, and these feelings do not go away.  You feel unusually sad or worried.  Your breasts become red, painful, or hard.  You have a fever.  You have trouble holding urine or keeping urine from leaking.  You have little or no interest in activities you used to enjoy.  You have not breastfed at all and you have not had a menstrual period for 12 weeks after delivery.  You have stopped breastfeeding and you have not had a menstrual period for 12 weeks after you stopped breastfeeding.  You have questions about caring for yourself or  your baby.  You pass a blood clot from your vagina. Get help right away if:  You have chest pain.  You have difficulty breathing.  You have sudden, severe leg pain.  You have severe pain or cramping in your lower abdomen.  You bleed from your vagina so much that you fill more than one sanitary pad in one hour. Bleeding should not be heavier than your heaviest period.  You develop a severe headache.  You faint.  You have blurred vision or spots in your vision.  You have bad-smelling vaginal discharge.  You have thoughts about hurting yourself or your baby. If you ever feel like you may hurt yourself or others, or have thoughts about taking your own life, get help right away. You can go to the nearest emergency department or call:  Your local emergency services (911 in the [place]  A suicide crisis helpline, such as the National Suicide Prevention Lifeline at 220-751-9218. This is open 24 hours a day. Summary  The period of time right after you deliver your newborn up to 6-12 weeks after delivery is called the postpartum period.  Gradually return to your normal activities as told by your health care provider.  Keep all follow-up visits for you and your baby as told by your health care provider. This information is not intended to replace advice given to you by your health care provider. Make sure you discuss any questions you have with your health care provider. Document Revised: 10/12/2017 Document Reviewed: 07/23/2017 Elsevier Patient Education  2020 [employer].  Postpartum Baby Blues The postpartum period begins right after the birth of a baby. During this time, there is often a lot of joy and excitement. It is also a time of many changes in the life of  the parents. No matter how many times a mother gives birth, each child brings new challenges to the family, including different ways of relating to one another. It is common to have feelings of excitement along with  confusing changes in moods, emotions, and thoughts. You may feel happy one minute and sad or stressed the next. These feelings of sadness usually happen in the period right after you have your baby, and they go away within a week or two. This is called the "baby blues." What are the causes? There is no known cause of baby blues. It is likely caused by a combination of factors. However, changes in hormone levels after childbirth are believed to trigger some of the symptoms. Other factors that can play a role in these mood changes include:  Lack of sleep.  Stressful life events, such as poverty, caring for a loved one, or death of a loved one.  Genetics. What are the signs or symptoms? Symptoms of this condition include:  Brief changes in mood, such as going from extreme happiness to sadness.  Decreased concentration.  Difficulty sleeping.  Crying spells and tearfulness.  Loss of appetite.  Irritability.  Anxiety. If the symptoms of baby blues last for more than 2 weeks or become more severe, you may have postpartum depression. How is this diagnosed? This condition is diagnosed based on an evaluation of your symptoms. There are no medical or lab tests that lead to a diagnosis, but there are various questionnaires that a health care provider may use to identify women with the baby blues or postpartum depression. How is this treated? Treatment is not needed for this condition. The baby blues usually go away on their own in 1-2 weeks. Social support is often all that is needed. You will be encouraged to get adequate sleep and rest. Follow these instructions at home: Lifestyle      Get as much rest as you can. Take a nap when the baby sleeps.  Exercise regularly as told by your health care provider. Some women find yoga and walking to be helpful.  Eat a balanced and nourishing diet. This includes plenty of fruits and vegetables, whole grains, and lean proteins.  Do little things  that you enjoy. Have a cup of tea, take a bubble bath, read your favorite magazine, or listen to your favorite music.  Avoid alcohol.  Ask for help with household chores, cooking, grocery shopping, or running errands. Do not try to do everything yourself. Consider hiring a postpartum doula to help. This is a professional who specializes in providing support to new mothers.  Try not to make any major life changes during pregnancy or right after giving birth. This can add stress. General instructions  Talk to people close to you about how you are feeling. Get support from your partner, family members, friends, or other new moms. You may want to join a support group.  Find ways to cope with stress. This may include: ? Writing your thoughts and feelings in a journal. ? Spending time outside. ? Spending time with people who make you laugh.  Try to stay positive in how you think. Think about the things you are grateful for.  Take over-the-counter and prescription medicines only as told by your health care provider.  Let your health care provider know if you have any concerns.  Keep all postpartum visits as told by your health care provider. This is important. Contact a health care provider if:  Your baby blues do  not go away after 2 weeks. Get help right away if:  You have thoughts of taking your own life (suicidal thoughts).  You think you may harm the baby or other people.  You see or hear things that are not there (hallucinations). Summary  After giving birth, you may feel happy one minute and sad or stressed the next. Feelings of sadness that happen right after the baby is born and go away after a week or two are called the "baby blues."  You can manage the baby blues by getting enough rest, eating a healthy diet, exercising, spending time with supportive people, and finding ways to cope with stress.  If feelings of sadness and stress last longer than 2 weeks or get in the way of  caring for your baby, talk to your health care provider. This may mean you have postpartum depression. This information is not intended to replace advice given to you by your health care provider. Make sure you discuss any questions you have with your health care provider. Document Revised: 01/31/2019 Document Reviewed: 12/05/2016 Elsevier Patient Education  2020 [employer].  Breastfeeding Tips for a Good Latch Latching is how your baby's mouth attaches to your nipple to breastfeed. It is an important part of breastfeeding. Your baby may have trouble latching for a number of reasons. A poor latch may cause you to have cracked or sore nipples or other problems. Follow these instructions at home: How to position your baby  Find a comfortable place to sit or lie down. Your neck and back should be well supported.  If you are seated, place a pillow or rolled-up blanket under your baby. This will bring him or her to the level of your breast.  Make sure that your baby's belly (abdomen) is facing your belly.  Try different positions to find one that works best for you and your baby. How to help your baby latch   To start, gently rub your breast. Move your fingertips in a circle as you massage from your chest wall toward your nipple. This helps milk flow. Keep doing this during feeding if needed.  Position your breast. Hold your breast with four fingers underneath and your thumb above your nipple. Keep your fingers away from your nipple and your baby's mouth. Follow these steps to help your baby latch: 1. Rub your baby's lips gently with your finger or nipple. 2. When your baby's mouth is open wide enough, quickly bring your baby to your breast and place your whole nipple into your baby's mouth. Place as much of the colored area around your nipple (areola)as possible into your baby's mouth. 3. Your baby's tongue should be between his or her lower gum and your breast. 4. You should be able to see  more areola above your baby's upper lip than below the lower lip. 5. When your baby starts sucking, you will feel a gentle pull on your nipple. You should not feel any pain. Be patient. It is common for a baby to suck for about 2-3 minutes to start the flow of breast milk. 6. Make sure that your baby's mouth is in the right position around your nipple. Your baby's lips should make a seal on your breast and be turned outward.  General instructions  Look for these signs that your baby has latched on to your nipple: ? The baby is quietly tugging or sucking without causing you pain. ? You hear the baby swallow after every 3 or 4 sucks. ?  You see movement above and in front of the baby's ears while he or she is sucking.  Be aware of these signs that your baby has not latched on to your nipple: ? The baby makes sucking sounds or smacking sounds while feeding. ? You have nipple pain.  If your baby is not latched well, put your little finger between your baby's gums and your nipple. This will break the seal. Then try to help your baby latch again.  If you keep having problems, get help from a breastfeeding specialist (Advertising copywriter). Contact a doctor if:  You have cracking or soreness in your nipples that lasts longer than 1 week.  You have nipple pain.  Your breasts are filled with too much milk (engorgement), and this does not improve after 48-72 hours.  You have a plugged milk duct and a fever.  You follow the tips for a good latch but you keep having problems or concerns.  You have a pus-like fluid coming from your breast.  Your baby is not gaining weight.  Your baby loses weight. Summary  Latching is how your baby's mouth attaches to your nipple to breastfeed.  Try different positions for breastfeeding to find one that works best for you and your baby.  A poor latch may cause you to have cracked or sore nipples or other problems. This information is not intended to  replace advice given to you by your health care provider. Make sure you discuss any questions you have with your health care provider. Document Revised: 01/29/2019 Document Reviewed: 05/16/2017 Elsevier Patient Education  2020 [employer].  Breastfeeding  Choosing to breastfeed is one of the best decisions you can make for yourself and your baby. A change in hormones during pregnancy causes your breasts to make breast milk in your milk-producing glands. Hormones prevent breast milk from being released before your baby is born. They also prompt milk flow after birth. Once breastfeeding has begun, thoughts of your baby, as well as his or her sucking or crying, can stimulate the release of milk from your milk-producing glands. Benefits of breastfeeding Research shows that breastfeeding offers many health benefits for infants and mothers. It also offers a cost-free and convenient way to feed your baby. For your baby  Your first milk (colostrum) helps your baby's digestive system to function better.  Special cells in your milk (antibodies) help your baby to fight off infections.  Breastfed babies are less likely to develop asthma, allergies, obesity, or type 2 diabetes. They are also at lower risk for sudden infant death syndrome (SIDS).  Nutrients in breast milk are better able to meet your baby's needs compared to infant formula.  Breast milk improves your baby's brain development. For you  Breastfeeding helps to create a very special bond between you and your baby.  Breastfeeding is convenient. Breast milk costs nothing and is always available at the correct temperature.  Breastfeeding helps to burn calories. It helps you to lose the weight that you gained during pregnancy.  Breastfeeding makes your uterus return faster to its size before pregnancy. It also slows bleeding (lochia) after you give birth.  Breastfeeding helps to lower your risk of developing type 2 diabetes, osteoporosis,  rheumatoid arthritis, cardiovascular disease, and breast, ovarian, uterine, and endometrial cancer later in life. Breastfeeding basics Starting breastfeeding  Find a comfortable place to sit or lie down, with your neck and back well-supported.  Place a pillow or a rolled-up blanket under your baby to bring him  or her to the level of your breast (if you are seated). Nursing pillows are specially designed to help support your arms and your baby while you breastfeed.  Make sure that your baby's tummy (abdomen) is facing your abdomen.  Gently massage your breast. With your fingertips, massage from the outer edges of your breast inward toward the nipple. This encourages milk flow. If your milk flows slowly, you may need to continue this action during the feeding.  Support your breast with 4 fingers underneath and your thumb above your nipple (make the letter "C" with your hand). Make sure your fingers are well away from your nipple and your baby's mouth.  Stroke your baby's lips gently with your finger or nipple.  When your baby's mouth is open wide enough, quickly bring your baby to your breast, placing your entire nipple and as much of the areola as possible into your baby's mouth. The areola is the colored area around your nipple. ? More areola should be visible above your baby's upper lip than below the lower lip. ? Your baby's lips should be opened and extended outward (flanged) to ensure an adequate, comfortable latch. ? Your baby's tongue should be between his or her lower gum and your breast.  Make sure that your baby's mouth is correctly positioned around your nipple (latched). Your baby's lips should create a seal on your breast and be turned out (everted).  It is common for your baby to suck about 2-3 minutes in order to start the flow of breast milk. Latching Teaching your baby how to latch onto your breast properly is very important. An improper latch can cause nipple pain, decreased  milk supply, and poor weight gain in your baby. Also, if your baby is not latched onto your nipple properly, he or she may swallow some air during feeding. This can make your baby fussy. Burping your baby when you switch breasts during the feeding can help to get rid of the air. However, teaching your baby to latch on properly is still the best way to prevent fussiness from swallowing air while breastfeeding. Signs that your baby has successfully latched onto your nipple  Silent tugging or silent sucking, without causing you pain. Infant's lips should be extended outward (flanged).  Swallowing heard between every 3-4 sucks once your milk has started to flow (after your let-down milk reflex occurs).  Muscle movement above and in front of his or her ears while sucking. Signs that your baby has not successfully latched onto your nipple  Sucking sounds or smacking sounds from your baby while breastfeeding.  Nipple pain. If you think your baby has not latched on correctly, slip your finger into the corner of your baby's mouth to break the suction and place it between your baby's gums. Attempt to start breastfeeding again. Signs of successful breastfeeding Signs from your baby  Your baby will gradually decrease the number of sucks or will completely stop sucking.  Your baby will fall asleep.  Your baby's body will relax.  Your baby will retain a small amount of milk in his or her mouth.  Your baby will let go of your breast by himself or herself. Signs from you  Breasts that have increased in firmness, weight, and size 1-3 hours after feeding.  Breasts that are softer immediately after breastfeeding.  Increased milk volume, as well as a change in milk consistency and color by the fifth day of breastfeeding.  Nipples that are not sore, cracked, or bleeding.  Signs that your baby is getting enough milk  Wetting at least 1-2 diapers during the first 24 hours after birth.  Wetting at  least 5-6 diapers every 24 hours for the first week after birth. The urine should be clear or pale yellow by the age of 5 days.  Wetting 6-8 diapers every 24 hours as your baby continues to grow and develop.  At least 3 stools in a 24-hour period by the age of 5 days. The stool should be soft and yellow.  At least 3 stools in a 24-hour period by the age of 7 days. The stool should be seedy and yellow.  No loss of weight greater than 10% of birth weight during the first 3 days of life.  Average weight gain of 4-7 oz (113-198 g) per week after the age of 4 days.  Consistent daily weight gain by the age of 5 days, without weight loss after the age of 2 weeks. After a feeding, your baby may spit up a small amount of milk. This is normal. Breastfeeding frequency and duration Frequent feeding will help you make more milk and can prevent sore nipples and extremely full breasts (breast engorgement). Breastfeed when you feel the need to reduce the fullness of your breasts or when your baby shows signs of hunger. This is called "breastfeeding on demand." Signs that your baby is hungry include:  Increased alertness, activity, or restlessness.  Movement of the head from side to side.  Opening of the mouth when the corner of the mouth or cheek is stroked (rooting).  Increased sucking sounds, smacking lips, cooing, sighing, or squeaking.  Hand-to-mouth movements and sucking on fingers or hands.  Fussing or crying. Avoid introducing a pacifier to your baby in the first 4-6 weeks after your baby is born. After this time, you may choose to use a pacifier. Research has shown that pacifier use during the first year of a baby's life decreases the risk of sudden infant death syndrome (SIDS). Allow your baby to feed on each breast as long as he or she wants. When your baby unlatches or falls asleep while feeding from the first breast, offer the second breast. Because newborns are often sleepy in the first few  weeks of life, you may need to awaken your baby to get him or her to feed. Breastfeeding times will vary from baby to baby. However, the following rules can serve as a guide to help you make sure that your baby is properly fed:  Newborns (babies 49 weeks of age or younger) may breastfeed every 1-3 hours.  Newborns should not go without breastfeeding for longer than 3 hours during the day or 5 hours during the night.  You should breastfeed your baby a minimum of 8 times in a 24-hour period. Breast milk pumping     Pumping and storing breast milk allows you to make sure that your baby is exclusively fed your breast milk, even at times when you are unable to breastfeed. This is especially important if you go back to work while you are still breastfeeding, or if you are not able to be present during feedings. Your lactation consultant can help you find a method of pumping that works best for you and give you guidelines about how long it is safe to store breast milk. Caring for your breasts while you breastfeed Nipples can become dry, cracked, and sore while breastfeeding. The following recommendations can help keep your breasts moisturized and healthy:  Avoid using  soap on your nipples.  Wear a supportive bra designed especially for nursing. Avoid wearing underwire-style bras or extremely tight bras (sports bras).  Air-dry your nipples for 3-4 minutes after each feeding.  Use only cotton bra pads to absorb leaked breast milk. Leaking of breast milk between feedings is normal.  Use lanolin on your nipples after breastfeeding. Lanolin helps to maintain your skin's normal moisture barrier. Pure lanolin is not harmful (not toxic) to your baby. You may also hand express a few drops of breast milk and gently massage that milk into your nipples and allow the milk to air-dry. In the first few weeks after giving birth, some women experience breast engorgement. Engorgement can make your breasts feel heavy,  warm, and tender to the touch. Engorgement peaks within 3-5 days after you give birth. The following recommendations can help to ease engorgement:  Completely empty your breasts while breastfeeding or pumping. You may want to start by applying warm, moist heat (in the shower or with warm, water-soaked hand towels) just before feeding or pumping. This increases circulation and helps the milk flow. If your baby does not completely empty your breasts while breastfeeding, pump any extra milk after he or she is finished.  Apply ice packs to your breasts immediately after breastfeeding or pumping, unless this is too uncomfortable for you. To do this: ? Put ice in a plastic bag. ? Place a towel between your skin and the bag. ? Leave the ice on for 20 minutes, 2-3 times a day.  Make sure that your baby is latched on and positioned properly while breastfeeding. If engorgement persists after 48 hours of following these recommendations, contact your health care provider or a Advertising [employer]. Overall health care recommendations while breastfeeding  Eat 3 healthy meals and 3 snacks every day. Well-nourished mothers who are breastfeeding need an additional 450-500 calories a day. You can meet this requirement by increasing the amount of a balanced diet that you eat.  Drink enough water to keep your urine pale yellow or clear.  Rest often, relax, and continue to take your prenatal vitamins to prevent fatigue, stress, and low vitamin and mineral levels in your body (nutrient deficiencies).  Do not use any products that contain nicotine or tobacco, such as cigarettes and e-cigarettes. Your baby may be harmed by chemicals from cigarettes that pass into breast milk and exposure to secondhand smoke. If you need help quitting, ask your health care provider.  Avoid alcohol.  Do not use illegal drugs or marijuana.  Talk with your health care provider before taking any medicines. These include over-the-counter  and prescription medicines as well as vitamins and herbal supplements. Some medicines that may be harmful to your baby can pass through breast milk.  It is possible to become pregnant while breastfeeding. If birth control is desired, ask your health care provider about options that will be safe while breastfeeding your baby. Where to find more information: [employer]: www.llli.org Contact a health care provider if:  You feel like you want to stop breastfeeding or have become frustrated with breastfeeding.  Your nipples are cracked or bleeding.  Your breasts are red, tender, or warm.  You have: ? Painful breasts or nipples. ? A swollen area on either breast. ? A fever or chills. ? Nausea or vomiting. ? Drainage other than breast milk from your nipples.  Your breasts do not become full before feedings by the fifth day after you give birth.  You feel sad  and depressed.  Your baby is: ? Too sleepy to eat well. ? Having trouble sleeping. ? More than 13 week old and wetting fewer than 6 diapers in a 24-hour period. ? Not gaining weight by 76 days of age.  Your baby has fewer than 3 stools in a 24-hour period.  Your baby's skin or the white parts of his or her eyes become yellow. Get help right away if:  Your baby is overly tired (lethargic) and does not want to wake up and feed.  Your baby develops an unexplained fever. Summary  Breastfeeding offers many health benefits for infant and mothers.  Try to breastfeed your infant when he or she shows early signs of hunger.  Gently tickle or stroke your baby's lips with your finger or nipple to allow the baby to open his or her mouth. Bring the baby to your breast. Make sure that much of the areola is in your baby's mouth. Offer one side and burp the baby before you offer the other side.  Talk with your health care provider or lactation consultant if you have questions or you face problems as you breastfeed. This  information is not intended to replace advice given to you by your health care provider. Make sure you discuss any questions you have with your health care provider. Document Revised: 01/03/2018 Document Reviewed: 11/10/2016 Elsevier Patient Education  2020 [employer] Inc[employer]  Breast Pumping Tips Breast pumping is a way to get milk out of your breasts. You will then store the milk for your baby to use when you are away from home. There are three ways to pump. You can:  Use your hand to massage and squeeze your breast (hand expression).  Use a hand-held machine to manually pump your milk.  Use an electric machine to pump your milk. In the beginning you may not get much milk. After a few days your breasts should make more. Pumping can help you start making milk after your baby is born. Pumping helps you to keep making milk when you are away from your baby. When should I pump? You can start pumping soon after your baby is born. Follow these tips:  When you are with your baby: ? Pump after you breastfeed. ? Pump from the free breast while you breastfeed.  When you are away from your baby: ? Pump every 2-3 hours for 15 minutes. ? Pump both breasts at the same time if you can.  If your baby drinks formula, pump around the time your baby gets the formula.  If you drank alcohol, wait 2 hours before you pump.  If you are going to have surgery, ask your doctor when you should pump again. How do I get ready to pump? Take steps to relax. Try these things to help your milk come in:  Smell your baby's blanket or clothes.  Look at a picture or video of your baby.  Sit in a quiet, private space.  Massage your breast and nipple.  Place a cloth on your breast. The cloth should be warm and a little wet.  Play relaxing music.  Picture your milk flowing. What are some tips? General tips for pumping breast milk   Always wash your hands before pumping.  If you do not get much milk or if  pumping hurts, try different pump settings or a different kind of pump.  Drink enough fluid so your pee (urine) is clear or pale yellow.  Wear clothing that opens in the front  or is easy to take off.  Pump milk into a clean bottle or container.  Do not use anything that has nicotine or tobacco. Examples are cigarettes and e-cigarettes. If you need help quitting, ask your doctor. Tips for storing breast milk   Store breast milk in a clean, BPA-free container. These include: ? A glass or plastic bottle. ? A milk storage bag.  Store only 2-4 ounces of breast milk in each container.  Swirl the breast milk in the container. Do not shake it.  Write down the date you pumped the milk on the container.  This is how long you can store breast milk: ? Room temperature: 6-8 hours. It is best to use the milk within 4 hours. ? Cooler with ice packs: 24 hours. ? Refrigerator: 5-8 days, if the milk is clean. It is best to use the milk within 3 days. ? Freezer: 9-12 months, if the milk is clean and stored away from the freezer door. It is best to use the milk within 6 months.  Put milk in the back of the refrigerator or freezer.  Thaw frozen milk using warm water. Do not use the microwave. Tips for choosing a breast pump When choosing a pump, keep the following things in mind:  Manual breast pumps do not need electricity. They cost less. They can be hard to use.  Electric breast pumps use electricity. They are more expensive. They are easier to use. They collect more milk.  The suction cup (flange) should be the right size.  Before you buy the pump, check if your insurance will pay for it. Tips for caring for a breast pump  Check the manual that came with your pump for cleaning tips.  Clean the pump after you use it. To do this: ? Wipe down the electrical part. Use a dry cloth or paper towel. Do not put this part in water or in cleaning products. ? Wash the plastic parts with soap and  warm water. Or use the dishwasher if the manual says it is safe. You do not need to clean the tubing unless it touched breast milk. ? Let all the parts air dry. Avoid drying them with a cloth or towel. ? When the parts are clean and dry, put the pump back together. Then store the pump.  If there is water in the tubing when you want to pump: 1. Attach the tubing to the pump. 2. Turn on the pump. 3. Turn off the pump when the tube is dry.  Try not to touch the inside of pump parts. Summary  Pumping can help you start making milk after your baby is born. It lets you keep making milk when you are away from your baby.  When you are away from your baby, pump for about 15 minutes every 2-3 hours. Pump both breasts at the same time, if you can. This information is not intended to replace advice given to you by your health care provider. Make sure you discuss any questions you have with your health care provider. Document Revised: 01/29/2019 Document Reviewed: 11/13/2016 Elsevier Patient Education  2020 [employer].

## 2020-07-30 NOTE — Anesthesia Postprocedure Evaluation (Signed)
Anesthesia Post Note  Patient: Brianna Grimes  Procedure(s) Performed: AN AD HOC LABOR EPIDURAL  Patient location during evaluation: Mother Baby Anesthesia Type: Epidural Level of consciousness: awake and alert Pain management: pain level controlled Vital Signs Assessment: post-procedure vital signs reviewed and stable Respiratory status: spontaneous breathing, nonlabored ventilation and respiratory function stable Cardiovascular status: stable Postop Assessment: no headache, no backache and epidural receding Anesthetic complications: no   No complications documented.   Last Vitals:  Vitals:   07/30/20 0505 07/30/20 0901  BP: (!) 103/59 112/66  Pulse: 76 73  Resp: 16 18  Temp: 37 C 36.7 C  SpO2: 98% 100%    Last Pain:  Vitals:   07/30/20 0901  TempSrc: Oral  PainSc:                  Karoline Caldwell

## 2020-07-30 NOTE — Progress Notes (Signed)
Pt discharged with infant in SCN. Discharge instructions, prescriptions, and follow up appointments given to and reviewed with patient. Pt verbalized understanding. Escorted out by self.

## 2020-08-02 LAB — SURGICAL PATHOLOGY

## 2021-02-22 ENCOUNTER — Emergency Department
Admission: EM | Admit: 2021-02-22 | Discharge: 2021-02-23 | Disposition: A | Discharge status: Home / Self Care | Payer: Medicaid Other | Attending: Emergency Medicine | Admitting: Emergency Medicine

## 2021-02-22 ENCOUNTER — Other Ambulatory Visit: Payer: Self-pay

## 2021-02-22 ENCOUNTER — Emergency Department: Payer: Medicaid Other

## 2021-02-22 DIAGNOSIS — J45909 Unspecified asthma, uncomplicated: Secondary | ICD-10-CM | POA: Insufficient documentation

## 2021-02-22 DIAGNOSIS — Z8616 Personal history of COVID-19: Secondary | ICD-10-CM | POA: Insufficient documentation

## 2021-02-22 DIAGNOSIS — M5126 Other intervertebral disc displacement, lumbar region: Secondary | ICD-10-CM

## 2021-02-22 DIAGNOSIS — M5442 Lumbago with sciatica, left side: Secondary | ICD-10-CM | POA: Insufficient documentation

## 2021-02-22 DIAGNOSIS — M545 Low back pain, unspecified: Secondary | ICD-10-CM | POA: Diagnosis present

## 2021-02-22 DIAGNOSIS — M5136 Other intervertebral disc degeneration, lumbar region: Secondary | ICD-10-CM

## 2021-02-22 DIAGNOSIS — R55 Syncope and collapse: Secondary | ICD-10-CM | POA: Insufficient documentation

## 2021-02-22 LAB — URINALYSIS, COMPLETE (UACMP) WITH MICROSCOPIC
Bacteria, UA: NONE SEEN
Bilirubin Urine: NEGATIVE
Glucose, UA: NEGATIVE mg/dL
Ketones, ur: NEGATIVE mg/dL
Nitrite: NEGATIVE
Protein, ur: NEGATIVE mg/dL
Specific Gravity, Urine: 1.019 (ref 1.005–1.030)
pH: 5 (ref 5.0–8.0)

## 2021-02-22 LAB — BASIC METABOLIC PANEL
Anion gap: 9 (ref 5–15)
BUN: 14 mg/dL (ref 6–20)
CO2: 23 mmol/L (ref 22–32)
Calcium: 9.1 mg/dL (ref 8.9–10.3)
Chloride: 107 mmol/L (ref 98–111)
Creatinine, Ser: 0.6 mg/dL (ref 0.44–1.00)
GFR, Estimated: >60 mL/min (ref >60)
Glucose, Bld: 98 mg/dL (ref 70–99)
Potassium: 3.8 mmol/L (ref 3.5–5.1)
Sodium: 139 mmol/L (ref 135–145)

## 2021-02-22 LAB — CBC
HCT: 39.5 % (ref 36.0–46.0)
Hemoglobin: 12.5 g/dL (ref 12.0–15.0)
MCH: 26.8 pg (ref 26.0–34.0)
MCHC: 31.6 g/dL (ref 30.0–36.0)
MCV: 84.8 fL (ref 80.0–100.0)
Platelets: 241 K/uL (ref 150–400)
RBC: 4.66 MIL/uL (ref 3.87–5.11)
RDW: 14.4 % (ref 11.5–15.5)
WBC: 7.7 K/uL (ref 4.0–10.5)
nRBC: 0 % (ref 0.0–0.2)

## 2021-02-22 LAB — HCG, QUANTITATIVE, PREGNANCY: hCG, Beta Chain, Quant, S: <1 m[IU]/mL

## 2021-02-22 LAB — LIPASE, BLOOD: Lipase: 27 U/L (ref 11–51)

## 2021-02-22 MED ORDER — MELOXICAM 15 MG PO TABS: 15.0000 mg | ORAL_TABLET | Freq: Every day | ORAL | 0 refills | Status: DC

## 2021-02-22 MED ORDER — KETOROLAC TROMETHAMINE 60 MG/2ML IM SOLN: 60.0000 mg | Freq: Once | INTRAMUSCULAR | Status: DC

## 2021-02-22 MED ORDER — MORPHINE SULFATE (PF) 4 MG/ML IV SOLN
4.0000 mg | Freq: Once | INTRAVENOUS | Status: AC
Start: 2021-02-22 — End: 2021-02-22
  Administered 2021-02-22: 4 mg via INTRAVENOUS
  Filled 2021-02-22: qty 1

## 2021-02-22 MED ORDER — METHOCARBAMOL 500 MG PO TABS: 500.0000 mg | ORAL_TABLET | Freq: Four times a day (QID) | ORAL | 0 refills | Status: DC

## 2021-02-22 MED ORDER — ORPHENADRINE CITRATE 30 MG/ML IJ SOLN
60.0000 mg | Freq: Once | INTRAMUSCULAR | Status: AC
  Administered 2021-02-22: 60 mg via INTRAVENOUS
  Filled 2021-02-22: qty 2

## 2021-02-22 MED ORDER — KETOROLAC TROMETHAMINE 30 MG/ML IJ SOLN
30.0000 mg | Freq: Once | INTRAMUSCULAR | Status: AC
  Administered 2021-02-22: 30 mg via INTRAVENOUS
  Filled 2021-02-22: qty 1

## 2021-02-22 MED ORDER — HYDROMORPHONE HCL 1 MG/ML IJ SOLN
1.0000 mg | Freq: Once | INTRAMUSCULAR | Status: AC
  Administered 2021-02-22: 1 mg via INTRAVENOUS
  Filled 2021-02-22: qty 1

## 2021-02-22 MED ORDER — OXYCODONE-ACETAMINOPHEN 5-325 MG PO TABS: 1.0000 | ORAL_TABLET | Freq: Four times a day (QID) | ORAL | 0 refills | Status: DC | PRN

## 2021-02-22 MED ORDER — DEXAMETHASONE SODIUM PHOSPHATE 10 MG/ML IJ SOLN
10.0000 mg | Freq: Once | INTRAMUSCULAR | Status: AC
  Administered 2021-02-22: 10 mg via INTRAVENOUS
  Filled 2021-02-22: qty 1

## 2021-02-22 NOTE — ED Triage Notes (Addendum)
Pt comes with c/o lower back pain. Pt states past 10/10 pain. Pt states this started 4 days ago. Pt states it started with leg pain.  Pt states pain is just so severe. No relief with any pain meds.  Pt states near syncopal at [facility] today. Pt states she did pass out yesterday. Pt placed on O2 at [facility]. Pt RA is 98% now at ED. O2 removed. Pt appears very uncomfortable in chair.

## 2021-02-22 NOTE — ED Notes (Signed)
No new orders per MD Vicente Males, pt ok for flex

## 2021-02-22 NOTE — ED Provider Notes (Signed)
-----------------------------------------   8:34 PM on 02/22/2021 -----------------------------------------  Blood pressure 127/66, pulse 64, temperature 98.5 F (36.9 C), temperature source Oral, resp. rate 16, height 5\' 8"  (1.727 m), weight (!) 141 kg, last menstrual period 02/14/2021, SpO2 99 %, unknown if currently breastfeeding.  Assuming care from Dr. 02/16/2021.  In short, Brianna Grimes is a 25 y.o. female with a chief complaint of Back Pain .  Refer to the original H&P for additional details.  The current plan of care is to await MRI.  Patient presented to the emergency department with sharp, severe lower back pain x2 days.  Patient had a syncopal episode likely due to pain earlier today.  Given the pain, paresthesias reported, patient is currently awaiting MRI.  Differential includes herniated disc, central cord compression, cauda equina.  Patient has received morphine and Toradol and reports almost complete resolution of pain at this time.  Labs, work-up to this time are reassuring and MRI is last item of work-up remaining.21    ----------------------------------------- 11:41 PM on 02/22/2021 -----------------------------------------  MRI results revealed no evidence of cauda equina, significant bulge affecting the central canal.  Patient does have a mild bulge consistent with bulging disc.  Patient will be given symptom control medications of anti-inflammatory muscle relaxer and pain medication at home.  Follow-up with neurosurgery.  No indication for further work-up.  Patient had good improvement with meds early.  Patient is still reporting that pain is starting to return so I will repeat pain medication, give the patient a muscle relaxer as well as a dose of steroid.  Return precautions discussed with the patient.   ED diagnosis:  Low back pain with sciatica Bulging disc    Jules Vidovich, 04/24/2021, PA-C 02/22/21 2342    2343, MD 02/26/21 [phone]

## 2021-02-22 NOTE — ED Provider Notes (Signed)
[facility] Emergency Department Provider Note   ____________________________________________   Event Date/Time   First MD Initiated Contact with Patient 02/22/21 1545     (approximate)  I have reviewed the triage vital signs and the nursing notes.   HISTORY  Chief Complaint Back Pain    HPI Brianna Grimes is a 25 y.o. female history of 2 previous pregnancies  Patient reports that in the past she has had occasional low back pain and seems to have been off and on ever since she had her epidural last pregnancy around October.  The last 2 days ago she has had severe low back pain.  Started out with a pain that radiated more down her left side towards her left buttock back of her hip and then now she has had a persistent severe pain in her left and mid low back.  She points towards her mid lumbar region.  She reports that the pain at 1 point yesterday was so severe that when the pain came on she briefly passed out.  She was not injured she was able to get her self up and does not think she was passed out for very long.  No chest pain no shortness of breath.  Denies pregnancy, last menstrual period was a couple weeks ago and reports she last had a baby in October  Some numbness will go down over her left buttock region associated.  No weakness in her legs.  Muscle still work.  No mid or upper back pain.  No headache.  No abdominal pain no nausea vomiting no vaginal bleeding or discharge   Past Medical History:  Diagnosis Date  . Asthma   . Vitamin D deficiency     Patient Active Problem List   Diagnosis Date Noted  . Labor and delivery, indication for care 07/28/2020  . Sinus tachycardia 07/22/2020  . COVID-19 virus infection 07/22/2020  . Maternal care for fetal tachycardia during pregnancy 07/21/2020  . Anxiety 12/20/2018  . Episode of recurrent major depressive disorder (HCC) 12/20/2018  . Encounter for supervision of normal pregnancy in  multigravida 09/19/2018  . 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 BMI (body mass index), pediatric, greater than 99% for age 43/25/2014  . 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 Vitamin D insufficiency 02/14/2013    Past Surgical History:  Procedure Laterality Date  . NO PAST SURGERIES      Prior to Admission medications   Medication Sig Start Date End Date Taking? Authorizing Provider  acetaminophen (TYLENOL) 325 MG tablet Take 2 tablets (650 mg total) by mouth every 4 (four) hours as needed for mild pain, moderate pain or headache. 10/01/18   Genia Del, CNM  albuterol (VENTOLIN HFA) 108 (90 Base) MCG/ACT inhaler Inhale 2 puffs into the lungs every 4 (four) hours as needed for wheezing. 03/06/18 07/07/21  [provider]  ergocalciferol (VITAMIN D2) 1.25 MG (50000 UT) capsule Take 50,000 Units by mouth once a week. 07/07/20   [provider]  ferrous sulfate 325 (65 FE) MG tablet Take 1 tablet (325 mg total) by mouth daily with breakfast. 07/30/20   Genia Del, CNM  ibuprofen (ADVIL) 600 MG tablet Take 1 tablet (600 mg total) by mouth every 6 (six) hours as needed for mild pain, moderate pain or cramping. 07/30/20   Genia Del, CNM  Prenatal Vit-Fe Fumarate-FA (MULTIVITAMIN-PRENATAL) 27-0.8 MG TABS tablet Take 1 tablet by mouth daily at 12 noon.    [provider]    Allergies Patient  has no known allergies.  Family History  Problem Relation Age of Onset  . Diabetes Mother   . Bipolar disorder Mother   . Hypertension Father   . Bipolar disorder Father   . Hypertension Brother   . Bipolar disorder Paternal Grandmother   . Diabetes Maternal Aunt   . Breast cancer Maternal Aunt   . Breast cancer Maternal Aunt     Social History Social History   Tobacco Use  . Smoking status: Never Smoker  . Smokeless tobacco: Never Used  Substance Use Topics  . Alcohol use: No  . Drug use: Yes    Types: Marijuana    Comment: yesterday     Review of Systems Constitutional: No fever/chills Eyes: No visual changes. Cardiovascular: Denies chest pain. Respiratory: Denies shortness of breath. Gastrointestinal: No abdominal pain.   Genitourinary: Negative for dysuria.  No changes in bowel or bladder habits.  Denies loss of sensation around the buttock Musculoskeletal: see HPI Skin: Negative for rash. Neurological: Negative for headaches, areas of focal weakness or numbness except as noted in HPI.    ____________________________________________   PHYSICAL EXAM:  VITAL SIGNS: ED Triage Vitals  Enc Vitals Group     BP 02/22/21 1456 126/66     Pulse Rate 02/22/21 1456 94     Resp 02/22/21 1456 18     Temp 02/22/21 1456 98.5 F (36.9 C)     Temp Source 02/22/21 1456 Oral     SpO2 02/22/21 1456 98 %     Weight 02/22/21 1557 (!) 310 lb 13.6 oz (141 kg)     Height 02/22/21 1557 5\' 8"  (1.727 m)     Head Circumference --      Peak Flow --      Pain Score 02/22/21 1451 10     Pain Loc --      Pain Edu? --      Excl. in [place]? --     Constitutional: Alert and oriented.  Appears in pain, laying in bed, on her side and over her left side and back eyes: Conjunctivae are normal. Head: Atraumatic. Nose: No congestion/rhinnorhea. Mouth/Throat: Mucous membranes are moist. Neck: No stridor.  Cardiovascular: Normal rate, regular rhythm. Grossly normal heart sounds.  Good peripheral circulation. Respiratory: Normal respiratory effort.  No retractions. Lungs CTAB. Gastrointestinal: Soft and nontender. No distention. Musculoskeletal: No lower extremity tenderness nor edema.  No cervical or thoracic tenderness.  Reports moderate to severe tenderness to palpation of the mid to lower lumbar spine.  No overlying lesion, no induration no rashes. Neurologic:  Normal speech and language. No gross focal neurologic deficits are appreciated.  Does move all extremities with normal strength.  5-5 strength lower extremities bilateral. Skin:   Skin is warm, dry and intact. No rash noted. Psychiatric: Mood and affect are normal. Speech and behavior are normal.  ____________________________________________   LABS (all labs ordered are listed, but only abnormal results are displayed)  Labs Reviewed  URINALYSIS, COMPLETE (UACMP) WITH MICROSCOPIC - Abnormal; Notable for the following components:      Result Value   Color, Urine YELLOW (*)    APPearance CLEAR (*)    Hgb urine dipstick SMALL (*)    Leukocytes,Ua TRACE (*)    All other components within normal limits  BASIC METABOLIC PANEL  CBC  LIPASE, BLOOD  HCG, QUANTITATIVE, PREGNANCY  CBG MONITORING, ED  POC URINE PREG, ED   ____________________________________________  EKG  ED ECG REPORT I, 04/24/21, the attending physician, personally  viewed and interpreted this ECG.  Date: 02/22/2021 EKG Time: 1602 Rhythm: normal sinus rhythm QRS Axis: normal Intervals: normal ST/T Wave abnormalities: normal Narrative Interpretation: no evidence of acute ischemia  ____________________________________________  RADIOLOGY  No results found.   ____________________________________________   PROCEDURES  Procedure(s) performed: None  Procedures  Critical Care performed: No  ____________________________________________   INITIAL IMPRESSION / ASSESSMENT AND PLAN / ED COURSE  Pertinent labs & imaging results that were available during my care of the patient were reviewed by me and considered in my medical decision making (see chart for details).   Low back pain, sudden severe, without trauma.  Also associated syncopal episode, her syncope work-up here quite reassuring.  No cardiac or pulmonary symptoms.  No neurologic symptoms to suggest head injury or cervical injury.  I am concerned about possible spinal etiology of her pain, she is afebrile with normal white count without infectious symptoms.  I have concern for disc protrusion herniation etc. is high on her  differential.  Given her amount of pain will treat with Toradol morphine, as well as obtain a MRI of her lumbar spine.  No associated abdominal or gynecologic symptoms.  Clinical Course as of 02/22/21 2034  Tue Feb 22, 2021  1810 Patient pain much improved. [MQ]    Clinical Course User Index [MQ] Sharyn Creamer, MD   MRI ordered, which to evaluate for acute spinal abnormality including cauda equina, spinal cord compression, mass lesion etc.  ----------------------------------------- 8:26 PM on 02/22/2021 -----------------------------------------  Ongoing care signed to Dr. Marisa Severin.  Follow-up on MRI lumbar spine, if reassuring would anticipate likely discharge with outpatient follow-up.  ____________________________________________   FINAL CLINICAL IMPRESSION(S) / ED DIAGNOSES  Final diagnoses:  Acute midline low back pain with left-sided sciatica        Note:  This document was prepared using Dragon voice recognition software and may include unintentional dictation errors       Sharyn Creamer, MD 02/22/21 2034

## 2021-02-23 ENCOUNTER — Emergency Department
Admission: EM | Admit: 2021-02-23 | Discharge: 2021-02-23 | Disposition: A | Discharge status: Home / Self Care | Payer: Medicaid Other | Source: Home / Self Care

## 2021-02-23 ENCOUNTER — Other Ambulatory Visit: Payer: Self-pay

## 2021-02-23 DIAGNOSIS — M545 Low back pain, unspecified: Secondary | ICD-10-CM | POA: Insufficient documentation

## 2021-02-23 DIAGNOSIS — Z5321 Procedure and treatment not carried out due to patient leaving prior to being seen by health care provider: Secondary | ICD-10-CM | POA: Insufficient documentation

## 2021-02-23 NOTE — ED Triage Notes (Signed)
Pt comes into the ED via EMS from home with c/o lower back pain, was seen for the same yesterday and states the medication is not helping.

## 2021-11-13 IMAGING — MR MR LUMBAR SPINE W/O CM
5 series · 32 of 48 positions shown · non-contrast
Comparison: None.

CLINICAL DATA: Low back pain

EXAM:
MRI LUMBAR SPINE WITHOUT CONTRAST
TECHNIQUE: Multiplanar, multisequence MR imaging of the lumbar spine was
performed. No intravenous contrast was administered.

[Series 9: T2 · sagittal · 4.0mm · 0.81mm/px · 6 of 17 slices shown (1 of 2)]
[im 1/17]
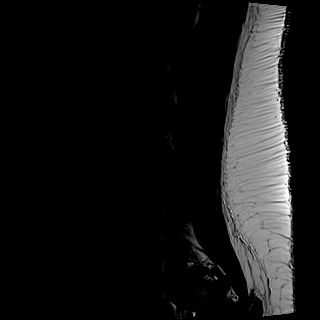
[im 4/17]
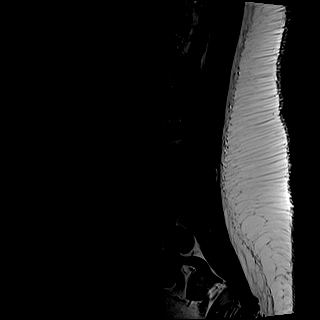
[im 7/17]
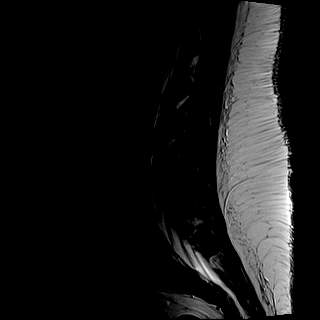
[im 10/17]
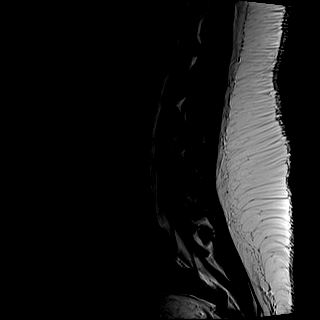
[im 13/17]
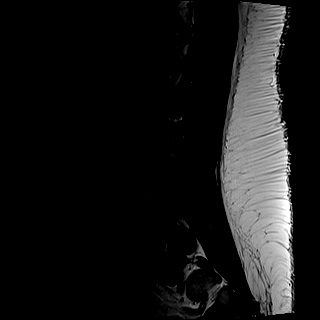
[im 17/17]
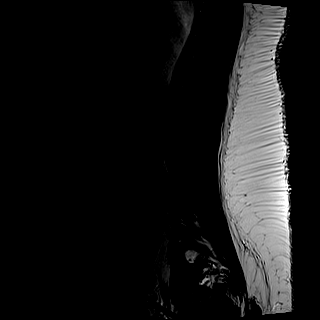

[Series 10: T1 · sagittal · 4.0mm · 0.81mm/px · 6 of 17 slices shown (1 of 2)]
[im 1/17]
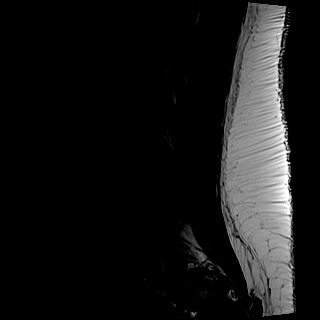
[im 4/17]
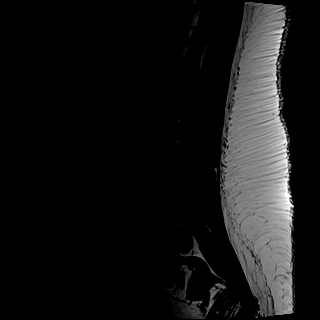
[im 7/17]
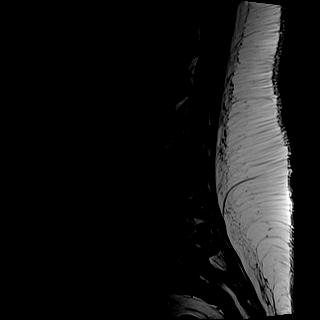
[im 10/17]
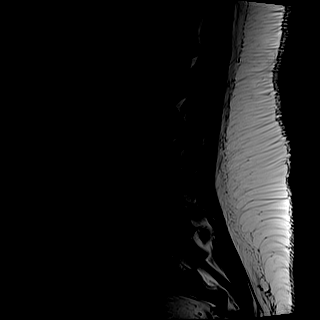
[im 13/17]
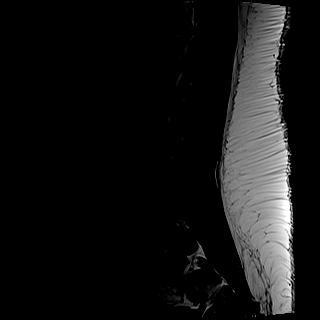
[im 17/17]
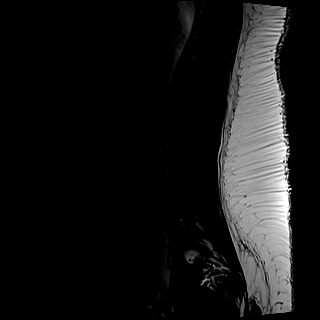

[Series 11: STIR · sagittal · 4.0mm · 0.41mm/px · 2 of 17 slices shown]
[im 1/17]
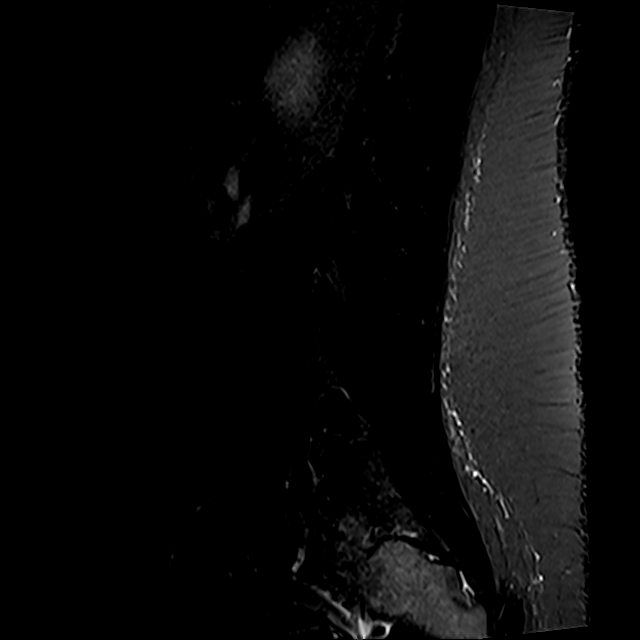
[im 4/17]
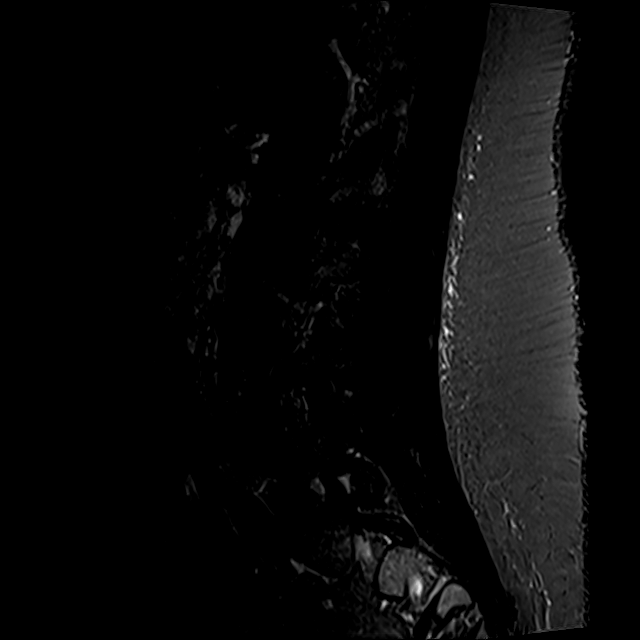

[Series 12: T2 · axial · 4.0mm · 0.78mm/px · z∈[+11,+234]mm · 9 of 40 slices shown (2 of 2)]
[im 1/40]
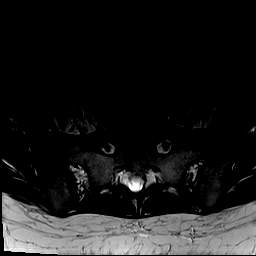
[im 6/40]
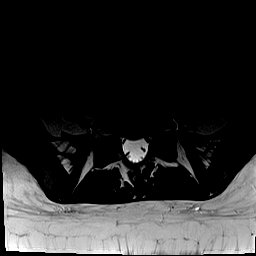
[im 12/40]
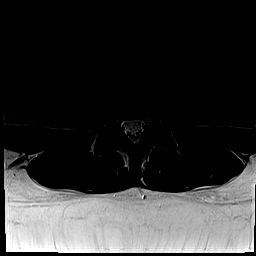
[im 17/40]
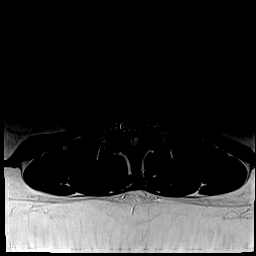
[im 20/40]
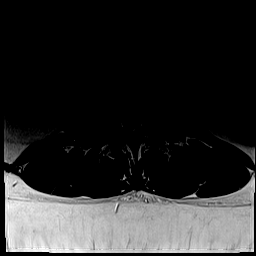
[im 23/40]
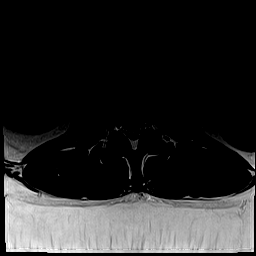
[im 28/40]
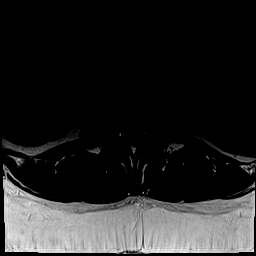
[im 34/40]
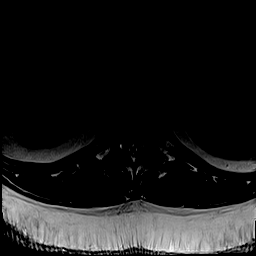
[im 40/40]
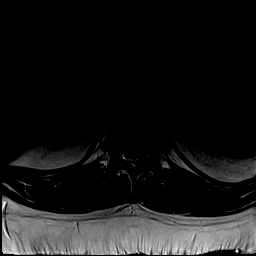

[Series 13: T1 · axial · 4.0mm · 0.39mm/px · z∈[+11,+234]mm · 9 of 40 slices shown (2 of 2)]
[im 1/40]
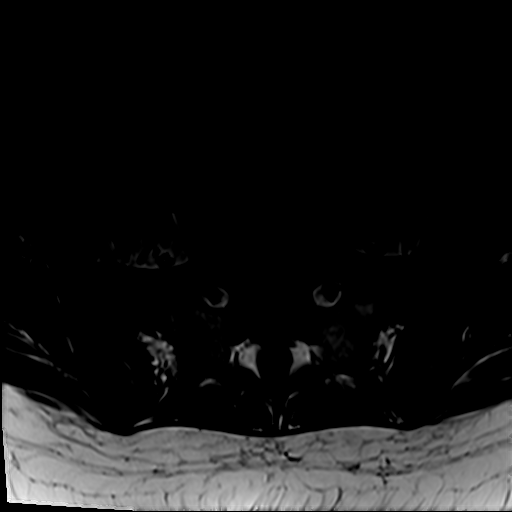
[im 6/40]
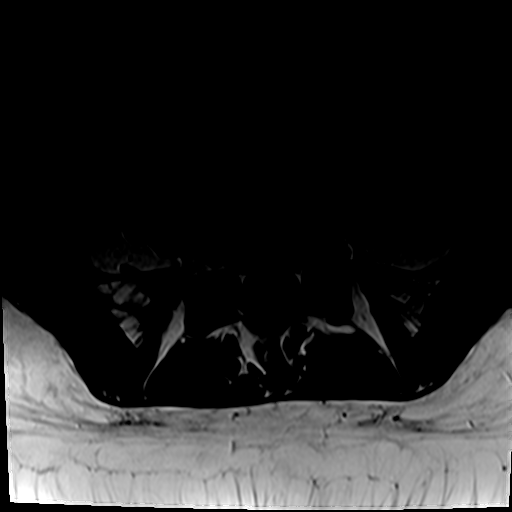
[im 12/40]
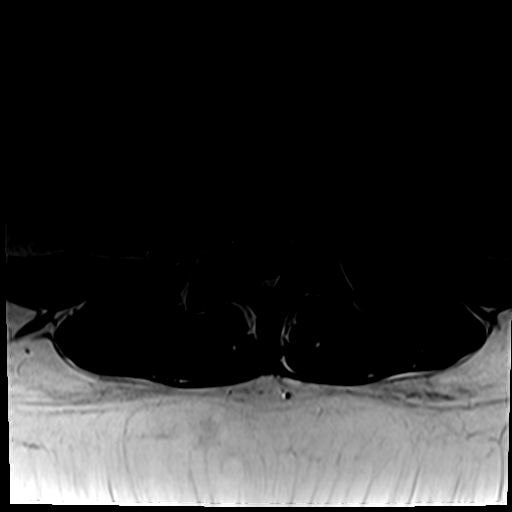
[im 17/40]
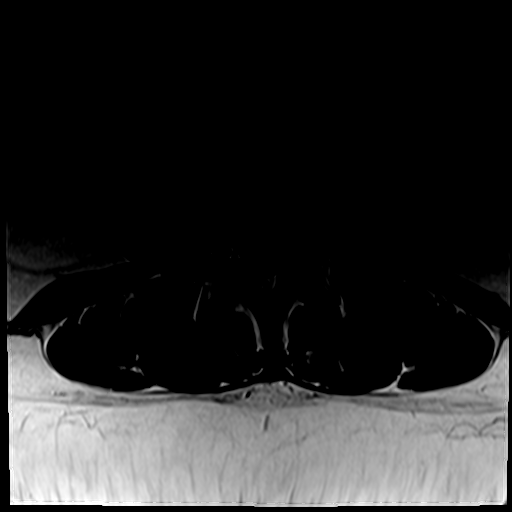
[im 20/40]
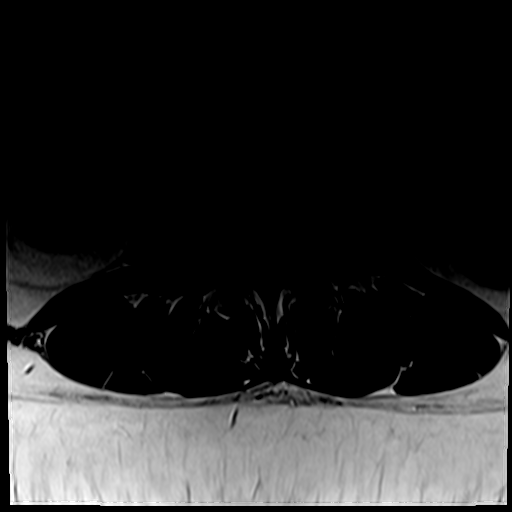
[im 23/40]
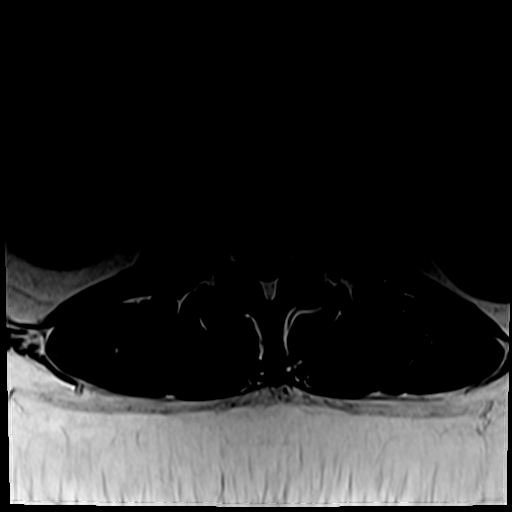
[im 28/40]
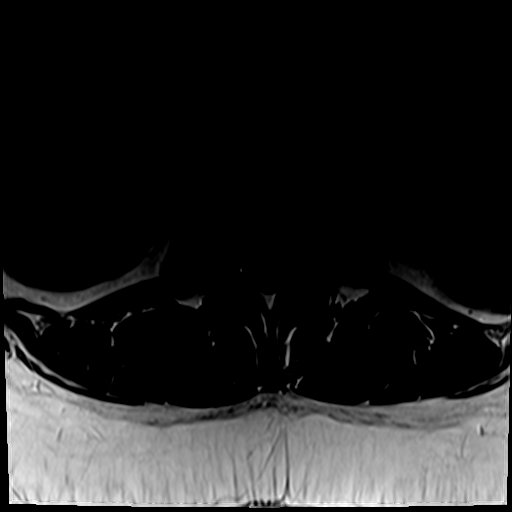
[im 34/40]
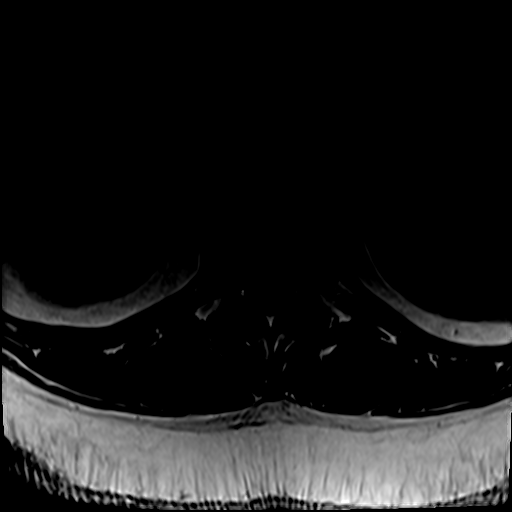
[im 40/40]
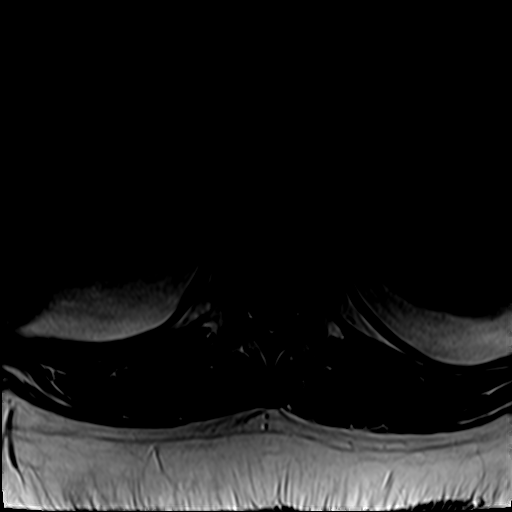

[32 of 48 positions shown; findings below may reference images not displayed]

FINDINGS: Segmentation:  Standard.

Alignment:  Physiologic.

Vertebrae:  No fracture, evidence of discitis, or bone lesion.

Conus medullaris and cauda equina: Conus extends to the L1 level.
Conus and cauda equina appear normal.

Paraspinal and other soft tissues: Negative.

Disc levels:

The disc levels from T12-L5 are normal.

L5-S1: Small central disc extrusion with inferior migration. No
spinal canal or neural foraminal stenosis.
IMPRESSION: Small central disc extrusion at L5-S1 without spinal canal or neural
foraminal stenosis. Otherwise normal lumbar spine.

## 2022-07-11 ENCOUNTER — Emergency Department
Admission: EM | Admit: 2022-07-11 | Discharge: 2022-07-11 | Disposition: A | Discharge status: Home / Self Care | Payer: Medicaid Other | Attending: Emergency Medicine | Admitting: Emergency Medicine

## 2022-07-11 DIAGNOSIS — I10 Essential (primary) hypertension: Secondary | ICD-10-CM | POA: Insufficient documentation

## 2022-07-11 DIAGNOSIS — N939 Abnormal uterine and vaginal bleeding, unspecified: Secondary | ICD-10-CM | POA: Insufficient documentation

## 2022-07-11 LAB — URINALYSIS, ROUTINE W REFLEX MICROSCOPIC
Bilirubin Urine: NEGATIVE
Glucose, UA: NEGATIVE mg/dL
Ketones, ur: NEGATIVE mg/dL
Leukocytes,Ua: NEGATIVE
Nitrite: NEGATIVE
Protein, ur: 100 mg/dL — AB
RBC / HPF: >50 RBC/hpf — ABNORMAL HIGH (ref 0–5)
Specific Gravity, Urine: 1.033 — ABNORMAL HIGH (ref 1.005–1.030)
pH: 5 (ref 5.0–8.0)

## 2022-07-11 LAB — CBC WITH DIFFERENTIAL/PLATELET
Abs Immature Granulocytes: 0.02 10*3/uL (ref 0.00–0.07)
Basophils Absolute: 0.1 10*3/uL (ref 0.0–0.1)
Basophils Relative: 1 %
Eosinophils Absolute: 0.1 10*3/uL (ref 0.0–0.5)
Eosinophils Relative: 2 %
HCT: 42 % (ref 36.0–46.0)
Hemoglobin: 12.9 g/dL (ref 12.0–15.0)
Immature Granulocytes: 0 %
Lymphocytes Relative: 30 %
Lymphs Abs: 2.8 10*3/uL (ref 0.7–4.0)
MCH: 27.3 pg (ref 26.0–34.0)
MCHC: 30.7 g/dL (ref 30.0–36.0)
MCV: 89 fL (ref 80.0–100.0)
Monocytes Absolute: 0.3 10*3/uL (ref 0.1–1.0)
Monocytes Relative: 4 %
Neutro Abs: 6 10*3/uL (ref 1.7–7.7)
Neutrophils Relative %: 63 %
Platelets: 260 10*3/uL (ref 150–400)
RBC: 4.72 MIL/uL (ref 3.87–5.11)
RDW: 14.3 % (ref 11.5–15.5)
WBC: 9.4 10*3/uL (ref 4.0–10.5)
nRBC: 0 % (ref 0.0–0.2)

## 2022-07-11 LAB — COMPREHENSIVE METABOLIC PANEL
ALT: 19 U/L (ref 0–44)
AST: 19 U/L (ref 15–41)
Albumin: 4.1 g/dL (ref 3.5–5.0)
Alkaline Phosphatase: 75 U/L (ref 38–126)
Anion gap: 8 (ref 5–15)
BUN: 19 mg/dL (ref 6–20)
CO2: 25 mmol/L (ref 22–32)
Calcium: 8.9 mg/dL (ref 8.9–10.3)
Chloride: 108 mmol/L (ref 98–111)
Creatinine, Ser: 0.73 mg/dL (ref 0.44–1.00)
GFR, Estimated: >60 mL/min (ref >60)
Glucose, Bld: 128 mg/dL — ABNORMAL HIGH (ref 70–99)
Potassium: 3.3 mmol/L — ABNORMAL LOW (ref 3.5–5.1)
Sodium: 141 mmol/L (ref 135–145)
Total Bilirubin: 0.4 mg/dL (ref 0.3–1.2)
Total Protein: 8 g/dL (ref 6.5–8.1)

## 2022-07-11 LAB — PREGNANCY, URINE: Preg Test, Ur: NEGATIVE

## 2022-07-11 LAB — POC URINE PREG, ED: Preg Test, Ur: NEGATIVE

## 2022-07-11 NOTE — ED Provider Notes (Signed)
[facility] Provider Note  Patient Contact: 10:45 PM (approximate)   History   Vaginal Bleeding (Heavy menstrual cycle )   HPI  Brianna Grimes is a 26 y.o. female presents to the emergency department with irregular vaginal bleeding.  Patient states that she had her typical menstrual cycle in early September.  She states that she did take Plan B and then had 7 days of heavy vaginal bleeding which is atypical for her.  She has also had occasional pelvic cramping.  No nausea or vomiting.      Physical Exam   Triage Vital Signs: ED Triage Vitals [07/11/22 1844]  Enc Vitals Group     BP (!) 166/81     Pulse Rate 75     Resp 18     Temp 98.4 F (36.9 C)     Temp Source Oral     SpO2 99 %     Weight 297 lb 9.9 oz (135 kg)     Height 5\' 8"  (1.727 m)     Head Circumference      Peak Flow      Pain Score 0     Pain Loc      Pain Edu?      Excl. in [place]?     Most recent vital signs: Vitals:   07/11/22 1844  BP: (!) 166/81  Pulse: 75  Resp: 18  Temp: 98.4 F (36.9 C)  SpO2: 99%     General: Alert and in no acute distress. Eyes:  PERRL. EOMI. Head: No acute traumatic findings ENT:      Nose: No congestion/rhinnorhea.      Mouth/Throat: Mucous membranes are moist. Neck: No stridor. No cervical spine tenderness to palpation. Cardiovascular:  Good peripheral perfusion Respiratory: Normal respiratory effort without tachypnea or retractions. Lungs CTAB. Good air entry to the bases with no decreased or absent breath sounds. Gastrointestinal: Bowel sounds 4 quadrants. Soft and nontender to palpation. No guarding or rigidity. No palpable masses. No distention. No CVA tenderness. Musculoskeletal: Full range of motion to all extremities.  Neurologic:  No gross focal neurologic deficits are appreciated.  Skin:   No rash noted Other:   ED Results / Procedures / Treatments   Labs (all labs ordered are listed, but only abnormal results are  displayed) Labs Reviewed  COMPREHENSIVE METABOLIC PANEL - Abnormal; Notable for the following components:      Result Value   Potassium 3.3 (*)    Glucose, Bld 128 (*)    All other components within normal limits  URINALYSIS, ROUTINE W REFLEX MICROSCOPIC - Abnormal; Notable for the following components:   Color, Urine YELLOW (*)    APPearance HAZY (*)    Specific Gravity, Urine 1.033 (*)    Hgb urine dipstick LARGE (*)    Protein, ur 100 (*)    RBC / HPF >50 (*)    Bacteria, UA RARE (*)    All other components within normal limits  CBC WITH DIFFERENTIAL/PLATELET  PREGNANCY, URINE  POC URINE PREG, ED         PROCEDURES:  Critical Care performed: No  Procedures   MEDICATIONS ORDERED IN ED: Medications - No data to display   IMPRESSION / MDM / [place] / ED COURSE  I reviewed the triage vital signs and the nursing notes.  Assessment and plan: Vaginal bleeding:  26 year old female presents to the emergency department with vaginal bleeding that occurred after patient's menstrual cycle stopped.  Patient was hypertensive at triage but vital signs were otherwise reassuring.  Hemoglobin and hematocrit were reassuring on CBC.  CMP largely within reference range.  Urinalysis indicates large amount of hemoglobin consistent with patient's chief complaint.  I offered patient a pelvic ultrasound in the emergency department and patient stated that she would like to go home and she can schedule pelvic ultrasound with her gynecologist.  Return precautions were given to return with new or worsening symptoms.  All patient questions were answered.   FINAL CLINICAL IMPRESSION(S) / ED DIAGNOSES   Final diagnoses:  Vaginal bleeding     Rx / DC Orders   ED Discharge Orders     None        Note:  This document was prepared using Dragon voice recognition software and may include unintentional dictation errors.   Pia Mau [place],  PA-C 07/11/22 2315    Sharman Cheek, MD 07/11/22 [phone]

## 2022-07-11 NOTE — ED Triage Notes (Signed)
Pt states heavier menstrual cycle than normal, she states no other complaints

## 2022-09-20 ENCOUNTER — Ambulatory Visit
Admission: EM | Admit: 2022-09-20 | Discharge: 2022-09-20 | Disposition: A | Discharge status: Home / Self Care | Payer: Medicaid Other | Attending: Family Medicine | Admitting: Family Medicine

## 2022-09-20 DIAGNOSIS — L819 Disorder of pigmentation, unspecified: Secondary | ICD-10-CM | POA: Diagnosis present

## 2022-09-20 NOTE — ED Triage Notes (Signed)
Pt reports she noticed a couple days ago that her left side is discolored a yellow/orange tint. Denies itching or pain

## 2022-09-20 NOTE — Discharge Instructions (Addendum)
The patch on your side does not appear to be infectious and does not require treatment.  We checked to see if you have diabetes. If elevated, someone will contact you.

## 2022-09-20 NOTE — ED Provider Notes (Signed)
[facility]    CSN: [id number] Arrival date & time: 09/20/22  1208      History   Chief Complaint Chief Complaint  Patient presents with   Skin Discoloration    HPI Brianna Grimes is a 26 y.o. female.   HPI  Brianna Grimes presents for skin discoloration that she noticed recently.  She is unsure how long its been there.  It does not itch.  There has been no discharge or bumps on it.  The area has a oranges yellow tent to it.  It is not painful.  She has not tried any treatments for it in the past.  Says she has history of insulin resistance and vitamin D.  She does not take her medications for this.  She is going through a "depressive episode".  Denies fever, abdominal pain, vomiting, nausea or new back pain.  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 Authorizing Provider  acetaminophen (TYLENOL) 325 MG tablet Take 2 tablets (650 mg total) by mouth every 4 (four) hours as needed for mild  pain, moderate pain or headache. 10/01/18   Genia Del, CNM  ergocalciferol (VITAMIN D2) 1.25 MG (50000 UT) capsule Take 50,000 Units by mouth once a week. 07/07/20   [provider]  ferrous sulfate 325 (65 FE) MG tablet Take 1 tablet (325 mg total) by mouth daily with breakfast. 07/30/20   Genia Del, CNM  ibuprofen (ADVIL) 600 MG tablet Take 1 tablet (600 mg total) by mouth every 6 (six) hours as needed for mild pain, moderate pain or cramping. 07/30/20   Genia Del, CNM  meloxicam (MOBIC) 15 MG tablet Take 1 tablet (15 mg total) by mouth daily. 02/22/21   Cuthriell, Delorise Royals, PA-C  methocarbamol (ROBAXIN) 500 MG tablet Take 1 tablet (500 mg total) by mouth 4 (four) times daily. 02/22/21   Cuthriell, Delorise Royals, PA-C  oxyCODONE-acetaminophen (PERCOCET/ROXICET) 5-325 MG tablet Take 1 tablet by mouth every 6 (six) hours as needed for severe pain. 02/22/21   Cuthriell, Delorise Royals, PA-C  Prenatal Vit-Fe Fumarate-FA (MULTIVITAMIN-PRENATAL) 27-0.8 MG TABS tablet Take 1 tablet by mouth daily at 12 noon.    [provider]    Family History  Family History  Problem Relation Age of Onset   Diabetes Mother    Bipolar disorder Mother    Hypertension Father    Bipolar disorder Father    Hypertension Brother    Bipolar disorder Paternal Grandmother    Diabetes Maternal Aunt    Breast cancer Maternal Aunt    Breast cancer Maternal Aunt     Social History Social History   Tobacco Use   Smoking status: Never   Smokeless tobacco: Never  Vaping Use   Vaping Use: Never used  Substance Use Topics   Alcohol use: Yes    Comment: social   Drug use: Yes    Types: Marijuana    Comment: today     Allergies   Patient has no known allergies.   Review of Systems Review of Systems : negative unless otherwise stated in HPI.      Physical Exam Triage Vital Signs ED Triage Vitals [09/20/22 1241]  Enc Vitals Group     BP (!) 141/78     Pulse Rate 78     Resp  18     Temp 98.1 F (36.7 C)     Temp Source Oral     SpO2 97 %     Weight 300 lb (136.1 kg)     Height 5\' 9"  (1.753 m)     Head Circumference      Peak Flow      Pain Score 0     Pain Loc      Pain Edu?      Excl. in [place]?    No data found.  Updated Vital Signs BP (!) 141/78 (BP Location: Right Arm)   Pulse 78   Temp 98.1 F (36.7 C) (Oral)   Resp 18   Ht 5\' 9"  (1.753 m)   Wt 136.1 kg   LMP 09/19/2022   SpO2 97%   BMI 44.30 kg/m   Visual Acuity Right Eye Distance:   Left Eye Distance:   Bilateral Distance:    Right Eye Near:   Left Eye Near:    Bilateral Near:     Physical Exam  GEN: alert, well appearing female, in no acute distress    HENT:  mucus membranes moist,  no nasal discharge  EYES:   pupils equal and reactive, EOM intact, no scleral icterus NECK:  supple, normal ROM, acanthosis nigracans RESP:  clear to auscultation bilaterally, no increased work of breathing  CVS:   regular rate and rhythm ABD:  soft, non-tender; bowel sounds present; no hepatosplenomegaly EXT:   normal ROM,  no edema  NEURO:  normal without focal findings,  speech normal, alert and oriented   Skin:   warm and dry, large light brown hyperpigmented patch on the left flank, no excoriations, no papules, no scales, no erythema, acanthosis nigra cans in the skin folds of her neck and abdomen Psych: tearful, appropriate speech and behavior    UC Treatments / Results  Labs (all labs ordered are listed, but only abnormal results are displayed) Labs Reviewed  HEMOGLOBIN A1C    EKG   Radiology No results found.  Procedures Procedures (including critical care time)  Medications Ordered in UC Medications - No data to display  Initial Impression / Assessment and Plan / UC Course  I have reviewed the triage vital signs and the nursing notes.  Pertinent labs & imaging results that were available during my care of the patient were reviewed by me and considered in my medical  decision making (see chart for details).     Patient is a 26 year old female with history of depression, vitamin D deficiency, anxiety and obesity who presents for left-sided skin lesion that was found incidentally a couple days ago.  She has no symptoms of infection or family history of autoimmune disease.  Has not enlarged.  No jaundice or scleral icterus.  On chart review, she had an elevated blood sugar, 128, in September.  Get A1c today.  I suspect this may be related to acanthosis nigracans.  As in her abdominal skin folds, she has evidence of this and the patch is adjacent.  Doubt fungal etiology.  Defer antimicrobials at this time.  Discussed MDM, treatment plan and plan for follow-up with patient/parent who agrees with plan.    Final Clinical Impressions(s) / UC Diagnoses   Final diagnoses:  Hyperpigmented skin lesion     Discharge Instructions      The patch on your side does not appear to be infectious and does not require treatment.  We checked to see if you have diabetes. If elevated, someone will contact you.        ED Prescriptions   None    PDMP not reviewed this encounter.   Katha Cabal, DO 09/20/22 1403

## 2022-09-21 LAB — HEMOGLOBIN A1C
Hgb A1c MFr Bld: 5.3 % (ref 4.8–5.6)
Mean Plasma Glucose: 105 mg/dL

## 2023-06-29 DIAGNOSIS — Z3490 Encounter for supervision of normal pregnancy, unspecified, unspecified trimester: Secondary | ICD-10-CM | POA: Insufficient documentation

## 2023-07-03 ENCOUNTER — Other Ambulatory Visit: Payer: Self-pay | Admitting: Obstetrics

## 2023-07-03 DIAGNOSIS — O99210 Obesity complicating pregnancy, unspecified trimester: Secondary | ICD-10-CM

## 2023-07-03 DIAGNOSIS — Z363 Encounter for antenatal screening for malformations: Secondary | ICD-10-CM

## 2023-07-23 LAB — OB RESULTS CONSOLE VARICELLA ZOSTER ANTIBODY, IGG: Varicella: NON-IMMUNE/NOT IMMUNE

## 2023-07-23 LAB — OB RESULTS CONSOLE RUBELLA ANTIBODY, IGM: Rubella: NON-IMMUNE/NOT IMMUNE

## 2023-07-23 LAB — OB RESULTS CONSOLE HEPATITIS B SURFACE ANTIGEN: Hepatitis B Surface Ag: NEGATIVE

## 2023-08-08 ENCOUNTER — Other Ambulatory Visit: Payer: Self-pay

## 2023-08-08 ENCOUNTER — Other Ambulatory Visit: Payer: Self-pay | Admitting: Obstetrics and Gynecology

## 2023-08-08 ENCOUNTER — Ambulatory Visit: Payer: Medicaid Other | Attending: Obstetrics and Gynecology

## 2023-08-08 DIAGNOSIS — O99212 Obesity complicating pregnancy, second trimester: Secondary | ICD-10-CM | POA: Diagnosis present

## 2023-08-08 DIAGNOSIS — O321XX0 Maternal care for breech presentation, not applicable or unspecified: Secondary | ICD-10-CM | POA: Diagnosis not present

## 2023-08-08 DIAGNOSIS — Z363 Encounter for antenatal screening for malformations: Secondary | ICD-10-CM | POA: Insufficient documentation

## 2023-08-08 DIAGNOSIS — E669 Obesity, unspecified: Secondary | ICD-10-CM | POA: Diagnosis not present

## 2023-08-08 DIAGNOSIS — O35EXX0 Maternal care for other (suspected) fetal abnormality and damage, fetal genitourinary anomalies, not applicable or unspecified: Secondary | ICD-10-CM | POA: Insufficient documentation

## 2023-08-08 DIAGNOSIS — O99210 Obesity complicating pregnancy, unspecified trimester: Secondary | ICD-10-CM

## 2023-08-08 DIAGNOSIS — Z3A19 19 weeks gestation of pregnancy: Secondary | ICD-10-CM | POA: Insufficient documentation

## 2023-08-08 DIAGNOSIS — Z87798 Personal history of other (corrected) congenital malformations: Secondary | ICD-10-CM

## 2023-08-22 ENCOUNTER — Other Ambulatory Visit: Payer: Self-pay

## 2023-08-22 ENCOUNTER — Observation Stay: Payer: Medicaid Other

## 2023-08-22 ENCOUNTER — Emergency Department: Payer: Medicaid Other

## 2023-08-22 ENCOUNTER — Observation Stay
Admission: EM | Admit: 2023-08-22 | Discharge: 2023-08-23 | Disposition: A | Discharge status: Home / Self Care | Payer: Medicaid Other | Attending: Obstetrics and Gynecology | Admitting: Obstetrics and Gynecology

## 2023-08-22 DIAGNOSIS — R0602 Shortness of breath: Secondary | ICD-10-CM | POA: Diagnosis not present

## 2023-08-22 DIAGNOSIS — R42 Dizziness and giddiness: Secondary | ICD-10-CM | POA: Diagnosis not present

## 2023-08-22 DIAGNOSIS — O26892 Other specified pregnancy related conditions, second trimester: Secondary | ICD-10-CM | POA: Diagnosis present

## 2023-08-22 DIAGNOSIS — Z3A21 21 weeks gestation of pregnancy: Secondary | ICD-10-CM | POA: Diagnosis not present

## 2023-08-22 DIAGNOSIS — O162 Unspecified maternal hypertension, second trimester: Secondary | ICD-10-CM | POA: Diagnosis present

## 2023-08-22 DIAGNOSIS — R109 Unspecified abdominal pain: Secondary | ICD-10-CM | POA: Insufficient documentation

## 2023-08-22 DIAGNOSIS — I10 Essential (primary) hypertension: Secondary | ICD-10-CM | POA: Insufficient documentation

## 2023-08-22 DIAGNOSIS — O163 Unspecified maternal hypertension, third trimester: Secondary | ICD-10-CM | POA: Diagnosis present

## 2023-08-22 DIAGNOSIS — O10012 Pre-existing essential hypertension complicating pregnancy, second trimester: Secondary | ICD-10-CM | POA: Diagnosis not present

## 2023-08-22 LAB — HEPATIC FUNCTION PANEL
ALT: 12 U/L (ref 0–44)
AST: 13 U/L — ABNORMAL LOW (ref 15–41)
Albumin: 3.1 g/dL — ABNORMAL LOW (ref 3.5–5.0)
Alkaline Phosphatase: 46 U/L (ref 38–126)
Bilirubin, Direct: <0.1 mg/dL (ref 0.0–0.2)
Total Bilirubin: 0.3 mg/dL (ref 0.3–1.2)
Total Protein: 6.9 g/dL (ref 6.5–8.1)

## 2023-08-22 LAB — URINALYSIS, ROUTINE W REFLEX MICROSCOPIC
Bilirubin Urine: NEGATIVE
Glucose, UA: 50 mg/dL — AB
Ketones, ur: 5 mg/dL — AB
Nitrite: NEGATIVE
Protein, ur: NEGATIVE mg/dL
Specific Gravity, Urine: 1.023 (ref 1.005–1.030)
pH: 6 (ref 5.0–8.0)

## 2023-08-22 LAB — POC URINE PREG, ED: Preg Test, Ur: POSITIVE — AB

## 2023-08-22 LAB — CBC
HCT: 34.2 % — ABNORMAL LOW (ref 36.0–46.0)
Hemoglobin: 11.2 g/dL — ABNORMAL LOW (ref 12.0–15.0)
MCH: 29.2 pg (ref 26.0–34.0)
MCHC: 32.7 g/dL (ref 30.0–36.0)
MCV: 89.1 fL (ref 80.0–100.0)
Platelets: 169 10*3/uL (ref 150–400)
RBC: 3.84 MIL/uL — ABNORMAL LOW (ref 3.87–5.11)
RDW: 14.2 % (ref 11.5–15.5)
WBC: 9.3 10*3/uL (ref 4.0–10.5)
nRBC: 0 % (ref 0.0–0.2)

## 2023-08-22 LAB — BASIC METABOLIC PANEL
Anion gap: 7 (ref 5–15)
BUN: 10 mg/dL (ref 6–20)
CO2: 24 mmol/L (ref 22–32)
Calcium: 8.5 mg/dL — ABNORMAL LOW (ref 8.9–10.3)
Chloride: 105 mmol/L (ref 98–111)
Creatinine, Ser: 0.41 mg/dL — ABNORMAL LOW (ref 0.44–1.00)
GFR, Estimated: >60 mL/min (ref >60)
Glucose, Bld: 119 mg/dL — ABNORMAL HIGH (ref 70–99)
Potassium: 3.4 mmol/L — ABNORMAL LOW (ref 3.5–5.1)
Sodium: 136 mmol/L (ref 135–145)

## 2023-08-22 LAB — PROTEIN / CREATININE RATIO, URINE
Creatinine, Urine: 115 mg/dL
Protein Creatinine Ratio: 0.11 mg/mg{creat} (ref 0.00–0.15)
Total Protein, Urine: 13 mg/dL

## 2023-08-22 LAB — HCG, QUANTITATIVE, PREGNANCY: hCG, Beta Chain, Quant, S: 7822 m[IU]/mL — ABNORMAL HIGH (ref <5)

## 2023-08-22 MED ORDER — ZOLPIDEM TARTRATE 5 MG PO TABS
5.0000 mg | ORAL_TABLET | Freq: Once | ORAL | Status: AC
  Administered 2023-08-23: 5 mg via ORAL
  Filled 2023-08-22: qty 1

## 2023-08-22 MED ORDER — ACETAMINOPHEN 500 MG PO TABS
1000.0000 mg | ORAL_TABLET | Freq: Four times a day (QID) | ORAL | Status: DC | PRN
  Administered 2023-08-22: 1000 mg via ORAL
  Filled 2023-08-22: qty 2

## 2023-08-22 NOTE — ED Provider Notes (Signed)
[facility] Provider Note    Event Date/Time   First MD Initiated Contact with Patient 08/22/23 2010     (approximate)   History   Near Syncope   HPI  Shanina Heidecker is a 27 year old G3, P2 at [redacted] weeks gestation presenting to the emergency department for evaluation of headache, fatigue, abdominal pain, shortness of breath.  Symptoms started earlier today.  Denies similar symptoms in prior pregnancies.  Also reports some mild lower extremity edema.  Does think that she may be has felt some decreased fetal movement today.  No vaginal bleeding.  No chest pain.      Physical Exam   Triage Vital Signs: ED Triage Vitals [08/22/23 1935]  Encounter Vitals Group     BP (!) 150/96     Systolic BP Percentile      Diastolic BP Percentile      Pulse Rate 97     Resp 18     Temp 98.3 F (36.8 C)     Temp Source Oral     SpO2 100 %     Weight 298 lb (135.2 kg)     Height 5\' 8"  (1.727 m)     Head Circumference      Peak Flow      Pain Score 2     Pain Loc      Pain Education      Exclude from Growth Chart     Most recent vital signs: Vitals:   08/22/23 2250 08/22/23 2305  BP: 124/70   Pulse: 84   Resp:  16  Temp:    SpO2:       General: Awake, interactive  CV:  Regular rate, good peripheral perfusion.  Resp:  Lungs clear to auscultation, respirations unlabored Abd:  Soft, nondistended, mild generalized tenderness without rebound or guarding Neuro:  Symmetric facial movement, fluid speech   ED Results / Procedures / Treatments   Labs (all labs ordered are listed, but only abnormal results are displayed) Labs Reviewed  BASIC METABOLIC PANEL - Abnormal; Notable for the following components:      Result Value   Potassium 3.4 (*)    Glucose, Bld 119 (*)    Creatinine, Ser 0.41 (*)    Calcium 8.5 (*)    All other components within normal limits  CBC - Abnormal; Notable for the following components:   RBC 3.84 (*)    Hemoglobin 11.2  (*)    HCT 34.2 (*)    All other components within normal limits  URINALYSIS, ROUTINE W REFLEX MICROSCOPIC - Abnormal; Notable for the following components:   Color, Urine YELLOW (*)    APPearance CLOUDY (*)    Glucose, UA 50 (*)    Hgb urine dipstick MODERATE (*)    Ketones, ur 5 (*)    Leukocytes,Ua TRACE (*)    Bacteria, UA RARE (*)    All other components within normal limits  HCG, QUANTITATIVE, PREGNANCY - Abnormal; Notable for the following components:   hCG, Beta Chain, Quant, S 7,822 (*)    All other components within normal limits  HEPATIC FUNCTION PANEL - Abnormal; Notable for the following components:   Albumin 3.1 (*)    AST 13 (*)    All other components within normal limits  POC URINE PREG, ED - Abnormal; Notable for the following components:   Preg Test, Ur Positive (*)    All other components within normal limits  WET PREP, GENITAL  PROTEIN /  CREATININE RATIO, URINE  CBG MONITORING, ED     EKG EKG independently reviewed interpreted by myself (ER attending) demonstrates:  EKG demonstrates sinus rhythm at rate of 91, PR 140, QRS 86, QTc 423, nonspecific ST changes noted  RADIOLOGY Imaging independently reviewed and interpreted by myself demonstrates:  Chest x-Khia Dieterich without focal consolidation  PROCEDURES:  Critical Care performed: No  Procedures   MEDICATIONS ORDERED IN ED: Medications  acetaminophen (TYLENOL) tablet 1,000 mg (1,000 mg Oral Given 08/22/23 2257)  zolpidem (AMBIEN) tablet 5 mg (has no administration in time range)     IMPRESSION / MDM / ASSESSMENT AND PLAN / ED COURSE  I reviewed the triage vital signs and the nursing notes.  Differential diagnosis includes, but is not limited to, Viral illness, preeclampsia, pneumonia, UTI  Patient's presentation is most consistent with acute presentation with potential threat to life or bodily function.  27 year old female presenting to the emergency department for evaluation of lightheadedness,  headache, abdominal pain, shortness of breath.  Did have elevated systolic 150 in triage, with clinical history concern for possible preeclampsia.  Per protocol, plan to send patient to [facility] triage but will evaluate for shortness of breath in the ER prior to doing so.  Will obtain x-Nagi Furio.  Overall lower suspicion for PE in the absence of tachycardia, and with multisystem involvement suggestive of alternative pathology.  X-Janisha Bueso without acute abnormality on my review.  Repeat blood pressure at bedside here with systolic of 139.  Ongoing concern for preeclampsia, but do think patient can be cleared from a respiratory perspective to go to [facility] triage for further evaluation.  Discussed with CNM Chari Manning who is agreeable with this plan.  Further evaluation per OB team.      FINAL CLINICAL IMPRESSION(S) / ED DIAGNOSES   Final diagnoses:  Lightheadedness  Hypertension during pregnancy in second trimester, unspecified hypertension in pregnancy type  Shortness of breath     Rx / DC Orders   ED Discharge Orders     None        Note:  This document was prepared using Dragon voice recognition software and may include unintentional dictation errors.   Trinna Post, MD 08/22/23 [phone]

## 2023-08-22 NOTE — Discharge Summary (Signed)
Brianna Grimes is a 27 y.o. female. She is at [redacted]w[redacted]d gestation. Patient's last menstrual period was 09/19/2022. Estimated Date of Delivery: 01/01/24  Prenatal care site: [facility]  Chief complaint: HA, blurry vision,dizziness, midline abdominal pain and elevated BP. She reports feeling anxious earlier this evening and begin having SOB, so she came to the ED.   HPI: Brianna Grimes presents to L&D with complaints of HA, blurry vision,dizziness, midline abdominal pain and elevated BP. Patient worked up and cleared by the ED for PE and SOB then sent to labor and delivery for [facility] evaluation  Factors complicating pregnancy: Obesity Anxiety and depression History of marijuana use Rubella non immune Left urinary tract dilation on anatomy scan  S: Resting comfortably. no CTX, no VB.no LOF,  Active fetal movement.   Maternal Medical History:  Past Medical Hx:  has a past medical history of Asthma and Vitamin D deficiency.    Past Surgical Hx:  has a past surgical history that includes No past surgeries.   No Known Allergies   Prior to Admission medications   Medication Sig Start Date End Date Taking? Authorizing Provider  acetaminophen (TYLENOL) 325 MG tablet Take 2 tablets (650 mg total) by mouth every 4 (four) hours as needed for mild pain, moderate pain or headache. 10/01/18   Genia Del, CNM  ergocalciferol (VITAMIN D2) 1.25 MG (50000 UT) capsule Take 50,000 Units by mouth once a week. Patient not taking: Reported on 08/08/2023 07/07/20   [provider]  ferrous sulfate 325 (65 FE) MG tablet Take 1 tablet (325 mg total) by mouth daily with breakfast. Patient not taking: Reported on 08/08/2023 07/30/20   Genia Del, CNM  ibuprofen (ADVIL) 600 MG tablet Take 1 tablet (600 mg total) by mouth every 6 (six) hours as needed for mild pain, moderate pain or cramping. Patient not taking: Reported on 08/08/2023 07/30/20   Genia Del, CNM  meloxicam (MOBIC) 15  MG tablet Take 1 tablet (15 mg total) by mouth daily. Patient not taking: Reported on 08/08/2023 02/22/21   Cuthriell, Delorise Royals, PA-C  methocarbamol (ROBAXIN) 500 MG tablet Take 1 tablet (500 mg total) by mouth 4 (four) times daily. Patient not taking: Reported on 08/08/2023 02/22/21   Cuthriell, Delorise Royals, PA-C  oxyCODONE-acetaminophen (PERCOCET/ROXICET) 5-325 MG tablet Take 1 tablet by mouth every 6 (six) hours as needed for severe pain. Patient not taking: Reported on 08/08/2023 02/22/21   Cuthriell, Delorise Royals, PA-C  Prenatal Vit-Fe Fumarate-FA (MULTIVITAMIN-PRENATAL) 27-0.8 MG TABS tablet Take 1 tablet by mouth daily at 12 noon.    [provider]    Social History: She  reports that she has never smoked. She has never used smokeless tobacco. She reports current alcohol use. She reports current drug use. Drug: Marijuana.  Family History: family history includes Bipolar disorder in her father, mother, and paternal grandmother; Breast cancer in her maternal aunt and maternal aunt; Diabetes in her maternal aunt and mother; Hypertension in her brother and father. ,no history of gyn cancers  Review of Systems: A full review of systems was performed and negative except as noted in the HPI.    O:  BP 124/70   Pulse 84   Temp 98.3 F (36.8 C) (Oral)   Resp 18   Ht 5\' 8"  (1.727 m)   Wt 135.2 kg   LMP 09/19/2022   SpO2 100%   BMI 45.31 kg/m  Results for orders placed or performed during the hospital encounter of 08/22/23 (from the past 48  hour(s))  Basic metabolic panel   Collection Time: 08/22/23  7:37 PM  Result Value Ref Range   Sodium 136 135 - 145 mmol/L   Potassium 3.4 (L) 3.5 - 5.1 mmol/L   Chloride 105 98 - 111 mmol/L   CO2 24 22 - 32 mmol/L   Glucose, Bld 119 (H) 70 - 99 mg/dL   BUN 10 6 - 20 mg/dL   Creatinine, Ser 1.47 (L) 0.44 - 1.00 mg/dL   Calcium 8.5 (L) 8.9 - 10.3 mg/dL   GFR, Estimated >82 >95 mL/min   Anion gap 7 5 - 15  CBC   Collection Time: 08/22/23   7:37 PM  Result Value Ref Range   WBC 9.3 4.0 - 10.5 K/uL   RBC 3.84 (L) 3.87 - 5.11 MIL/uL   Hemoglobin 11.2 (L) 12.0 - 15.0 g/dL   HCT 62.1 (L) 30.8 - 65.7 %   MCV 89.1 80.0 - 100.0 fL   MCH 29.2 26.0 - 34.0 pg   MCHC 32.7 30.0 - 36.0 g/dL   RDW 84.6 96.2 - 95.2 %   Platelets 169 150 - 400 K/uL   nRBC 0.0 0.0 - 0.2 %  Urinalysis, Routine w reflex microscopic -Urine, Clean Catch   Collection Time: 08/22/23  7:37 PM  Result Value Ref Range   Color, Urine YELLOW (A) YELLOW   APPearance CLOUDY (A) CLEAR   Specific Gravity, Urine 1.023 1.005 - 1.030   pH 6.0 5.0 - 8.0   Glucose, UA 50 (A) NEGATIVE mg/dL   Hgb urine dipstick MODERATE (A) NEGATIVE   Bilirubin Urine NEGATIVE NEGATIVE   Ketones, ur 5 (A) NEGATIVE mg/dL   Protein, ur NEGATIVE NEGATIVE mg/dL   Nitrite NEGATIVE NEGATIVE   Leukocytes,Ua TRACE (A) NEGATIVE   RBC / HPF 6-10 0 - 5 RBC/hpf   WBC, UA 0-5 0 - 5 WBC/hpf   Bacteria, UA RARE (A) NONE SEEN   Squamous Epithelial / HPF 0-5 0 - 5 /HPF   Mucus PRESENT    Hyaline Casts, UA PRESENT   hCG, quantitative, pregnancy   Collection Time: 08/22/23  7:37 PM  Result Value Ref Range   hCG, Beta Chain, Quant, S 7,822 (H) <5 mIU/mL  Hepatic function panel   Collection Time: 08/22/23  7:37 PM  Result Value Ref Range   Total Protein 6.9 6.5 - 8.1 g/dL   Albumin 3.1 (L) 3.5 - 5.0 g/dL   AST 13 (L) 15 - 41 U/L   ALT 12 0 - 44 U/L   Alkaline Phosphatase 46 38 - 126 U/L   Total Bilirubin 0.3 0.3 - 1.2 mg/dL   Bilirubin, Direct <8.4 0.0 - 0.2 mg/dL   Indirect Bilirubin NOT CALCULATED 0.3 - 0.9 mg/dL  POC urine preg, ED   Collection Time: 08/22/23  8:24 PM  Result Value Ref Range   Preg Test, Ur Positive (A) Negative     Constitutional: NAD, AAOx3  HE/ENT: extraocular movements grossly intact, moist mucous membranes CV: RRR PULM: nl respiratory effort, CTABL Abd: gravid, non-tender, non-distended, soft  Ext: Non-tender, Nonedmeatous Psych: mood appropriate, speech  normal Pelvic : deferred SVE:     FHR: 160 beats/min  Assessment: 27 y.o. [redacted]w[redacted]d here for antenatal surveillance during pregnancy. Patient resting in bed, reporting that she believes her symptoms were due to her anxiety and that she now feels reassured that her baby is okay and her HA and vision has improved with the Tylenol given.   Plan: Labor: not present.  Fetal Wellbeing:  Reassuring Cat 1 tracing. Reactive NST  BP normotensive PIH labs wnl D/c home stable, precautions reviewed, follow-up as scheduled.   ----- Chari Manning, CNM Certified Nurse Midwife [facility]

## 2023-08-22 NOTE — ED Triage Notes (Signed)
Pt reports feeling like she was going to pass out and has had nausea, headache and shortness of breath for the past few days. Pt is [redacted] weeks pregnant, G3P2. Denies vaginal bleeding.

## 2023-08-22 NOTE — ED Notes (Addendum)
Pt reports G3P2, pt at [facility]; presents for HTN, abd pain, HA, SHOB; called and spoke with [place] RN on L&D who st midwife Chari Manning would like pt cleared for her [facility] prior to tx there; informed Solon Augusta, RN charge nurse and Dr Rosalia Hammers who will come to triage to eval pt; acuity level changed

## 2023-08-22 NOTE — OB Triage Note (Signed)
Brianna Grimes 27 y.o. G3P2 [redacted]w[redacted]d presents to Labor & Delivery triage via wheelchair steered by ED staff reporting blurry vision, abdominal pain above the umbilicus, left flank pain, anxiety, and SOB. 3 days ago she was standing and suddenly felt the room moving, and her vision went black. This has happened a few more times including earlier today. She reports a severe headache earlier which caused nausea. She states the headache is just nagging now and the nausea has improved. She has felt increased pelvic pressure recently. She denies signs and symptoms consistent with rupture of membranes or active vaginal bleeding. She denies contractions and states positive fetal movement.  Doppler FHR 160. Vital signs obtained and within normal limits. BP cycling q 15. No clonus, +2 reflexes. Patient oriented to care environment including call bell and bed control use. Chari Manning, CNM in department and notified of patient's arrival.

## 2023-08-23 LAB — WET PREP, GENITAL
Clue Cells Wet Prep HPF POC: NONE SEEN
Sperm: NONE SEEN
Trich, Wet Prep: NONE SEEN
WBC, Wet Prep HPF POC: <10 (ref <10)
Yeast Wet Prep HPF POC: NONE SEEN

## 2023-08-23 NOTE — OB Triage Note (Signed)
Patient discharged home per order.  She is stable and ambulatory. An After Visit Summary was printed and given to the patient. Discharge education completed with patient and support person including follow up instructions, appointments, and medication list. She received labor and bleeding precautions. Patient able to verbalize understanding. All questions fully answered upon discharge. Patient instructed to return to ED, call 911, or call provider for any changes in condition. Patient discharged home via personal vehicle with support person and all belongings.    

## 2023-08-23 NOTE — Telephone Encounter (Signed)
This encounter was created in error - please disregard.

## 2023-09-12 ENCOUNTER — Other Ambulatory Visit: Payer: Self-pay

## 2023-09-12 ENCOUNTER — Ambulatory Visit: Payer: Medicaid Other | Attending: Maternal & Fetal Medicine

## 2023-09-12 DIAGNOSIS — Z3A24 24 weeks gestation of pregnancy: Secondary | ICD-10-CM | POA: Diagnosis not present

## 2023-09-12 DIAGNOSIS — E669 Obesity, unspecified: Secondary | ICD-10-CM | POA: Diagnosis not present

## 2023-09-12 DIAGNOSIS — O352XX0 Maternal care for (suspected) hereditary disease in fetus, not applicable or unspecified: Secondary | ICD-10-CM | POA: Diagnosis not present

## 2023-09-12 DIAGNOSIS — O35EXX0 Maternal care for other (suspected) fetal abnormality and damage, fetal genitourinary anomalies, not applicable or unspecified: Secondary | ICD-10-CM | POA: Diagnosis present

## 2023-09-12 DIAGNOSIS — O99212 Obesity complicating pregnancy, second trimester: Secondary | ICD-10-CM | POA: Diagnosis not present

## 2023-09-17 ENCOUNTER — Other Ambulatory Visit: Payer: Self-pay | Admitting: *Deleted

## 2023-09-17 DIAGNOSIS — O35EXX0 Maternal care for other (suspected) fetal abnormality and damage, fetal genitourinary anomalies, not applicable or unspecified: Secondary | ICD-10-CM

## 2023-10-03 LAB — OB RESULTS CONSOLE RPR: RPR: NONREACTIVE

## 2023-10-19 ENCOUNTER — Encounter: Payer: Self-pay | Admitting: Obstetrics and Gynecology

## 2023-10-19 ENCOUNTER — Emergency Department
Admission: EM | Admit: 2023-10-19 | Discharge: 2023-10-19 | Disposition: A | Discharge status: Home / Self Care | Payer: Medicaid Other | Attending: Emergency Medicine | Admitting: Emergency Medicine

## 2023-10-19 ENCOUNTER — Other Ambulatory Visit: Payer: Self-pay

## 2023-10-19 ENCOUNTER — Observation Stay
Admission: EM | Admit: 2023-10-19 | Discharge: 2023-10-19 | Disposition: A | Discharge status: Home / Self Care | Payer: Medicaid Other | Attending: Emergency Medicine | Admitting: Emergency Medicine

## 2023-10-19 DIAGNOSIS — J069 Acute upper respiratory infection, unspecified: Secondary | ICD-10-CM | POA: Insufficient documentation

## 2023-10-19 DIAGNOSIS — R1030 Lower abdominal pain, unspecified: Secondary | ICD-10-CM | POA: Insufficient documentation

## 2023-10-19 DIAGNOSIS — N393 Stress incontinence (female) (male): Secondary | ICD-10-CM | POA: Diagnosis not present

## 2023-10-19 DIAGNOSIS — O99213 Obesity complicating pregnancy, third trimester: Secondary | ICD-10-CM | POA: Insufficient documentation

## 2023-10-19 DIAGNOSIS — O353XX0 Maternal care for (suspected) damage to fetus from viral disease in mother, not applicable or unspecified: Secondary | ICD-10-CM | POA: Insufficient documentation

## 2023-10-19 DIAGNOSIS — Z3A29 29 weeks gestation of pregnancy: Secondary | ICD-10-CM | POA: Insufficient documentation

## 2023-10-19 DIAGNOSIS — O26893 Other specified pregnancy related conditions, third trimester: Secondary | ICD-10-CM | POA: Diagnosis present

## 2023-10-19 DIAGNOSIS — O133 Gestational [pregnancy-induced] hypertension without significant proteinuria, third trimester: Secondary | ICD-10-CM | POA: Insufficient documentation

## 2023-10-19 DIAGNOSIS — O99891 Other specified diseases and conditions complicating pregnancy: Secondary | ICD-10-CM | POA: Diagnosis present

## 2023-10-19 DIAGNOSIS — R109 Unspecified abdominal pain: Secondary | ICD-10-CM | POA: Diagnosis not present

## 2023-10-19 DIAGNOSIS — Z20822 Contact with and (suspected) exposure to covid-19: Secondary | ICD-10-CM | POA: Insufficient documentation

## 2023-10-19 DIAGNOSIS — O163 Unspecified maternal hypertension, third trimester: Secondary | ICD-10-CM | POA: Diagnosis present

## 2023-10-19 DIAGNOSIS — N3945 Continuous leakage: Secondary | ICD-10-CM | POA: Diagnosis not present

## 2023-10-19 DIAGNOSIS — O99513 Diseases of the respiratory system complicating pregnancy, third trimester: Secondary | ICD-10-CM | POA: Insufficient documentation

## 2023-10-19 DIAGNOSIS — O26899 Other specified pregnancy related conditions, unspecified trimester: Secondary | ICD-10-CM | POA: Diagnosis present

## 2023-10-19 DIAGNOSIS — O4290 Premature rupture of membranes, unspecified as to length of time between rupture and onset of labor, unspecified weeks of gestation: Principal | ICD-10-CM | POA: Diagnosis not present

## 2023-10-19 DIAGNOSIS — E66813 Obesity, class 3: Secondary | ICD-10-CM | POA: Insufficient documentation

## 2023-10-19 LAB — URINALYSIS, COMPLETE (UACMP) WITH MICROSCOPIC
Bilirubin Urine: NEGATIVE
Glucose, UA: >=500 mg/dL — AB
Ketones, ur: NEGATIVE mg/dL
Nitrite: NEGATIVE
Protein, ur: 30 mg/dL — AB
Specific Gravity, Urine: 1.026 (ref 1.005–1.030)
pH: 6 (ref 5.0–8.0)

## 2023-10-19 LAB — COMPREHENSIVE METABOLIC PANEL
ALT: 14 U/L (ref 0–44)
AST: 15 U/L (ref 15–41)
Albumin: 2.8 g/dL — ABNORMAL LOW (ref 3.5–5.0)
Alkaline Phosphatase: 64 U/L (ref 38–126)
Anion gap: 10 (ref 5–15)
BUN: 6 mg/dL (ref 6–20)
CO2: 19 mmol/L — ABNORMAL LOW (ref 22–32)
Calcium: 7.9 mg/dL — ABNORMAL LOW (ref 8.9–10.3)
Chloride: 106 mmol/L (ref 98–111)
Creatinine, Ser: 0.39 mg/dL — ABNORMAL LOW (ref 0.44–1.00)
GFR, Estimated: >60 mL/min (ref >60)
Glucose, Bld: 87 mg/dL (ref 70–99)
Potassium: 3.4 mmol/L — ABNORMAL LOW (ref 3.5–5.1)
Sodium: 135 mmol/L (ref 135–145)
Total Bilirubin: 0.3 mg/dL (ref <1.2)
Total Protein: 6.6 g/dL (ref 6.5–8.1)

## 2023-10-19 LAB — RESP PANEL BY RT-PCR (RSV, FLU A&B, COVID)  RVPGX2
Influenza A by PCR: NEGATIVE
Influenza B by PCR: NEGATIVE
Resp Syncytial Virus by PCR: NEGATIVE
SARS Coronavirus 2 by RT PCR: NEGATIVE

## 2023-10-19 LAB — WET PREP, GENITAL
Clue Cells Wet Prep HPF POC: NONE SEEN
Sperm: NONE SEEN
Trich, Wet Prep: NONE SEEN
WBC, Wet Prep HPF POC: >=10 — AB (ref <10)
Yeast Wet Prep HPF POC: NONE SEEN

## 2023-10-19 LAB — CBC
HCT: 34.6 % — ABNORMAL LOW (ref 36.0–46.0)
Hemoglobin: 11.3 g/dL — ABNORMAL LOW (ref 12.0–15.0)
MCH: 28.5 pg (ref 26.0–34.0)
MCHC: 32.7 g/dL (ref 30.0–36.0)
MCV: 87.4 fL (ref 80.0–100.0)
Platelets: 166 10*3/uL (ref 150–400)
RBC: 3.96 MIL/uL (ref 3.87–5.11)
RDW: 14.8 % (ref 11.5–15.5)
WBC: 6.7 10*3/uL (ref 4.0–10.5)
nRBC: 0 % (ref 0.0–0.2)

## 2023-10-19 LAB — SARS CORONAVIRUS 2 BY RT PCR: SARS Coronavirus 2 by RT PCR: NEGATIVE

## 2023-10-19 LAB — PROTEIN / CREATININE RATIO, URINE
Creatinine, Urine: 167 mg/dL
Protein Creatinine Ratio: 0.18 mg/mg{creat} — ABNORMAL HIGH (ref 0.00–0.15)
Total Protein, Urine: 30 mg/dL

## 2023-10-19 LAB — FETAL FIBRONECTIN: Fetal Fibronectin: NEGATIVE

## 2023-10-19 LAB — RUPTURE OF MEMBRANE (ROM)PLUS: Rom Plus: NEGATIVE

## 2023-10-19 MED ORDER — GUAIFENESIN 100 MG/5ML PO LIQD
20.0000 mL | Freq: Once | ORAL | Status: AC
  Administered 2023-10-19: 20 mL via ORAL
  Filled 2023-10-19: qty 20

## 2023-10-19 MED ORDER — ACETAMINOPHEN 500 MG PO TABS: 1000.0000 mg | ORAL_TABLET | Freq: Once | ORAL | Status: DC

## 2023-10-19 NOTE — Discharge Instructions (Signed)
Patient discharged to home per MD order. Patient in stable condition, and deemed medically cleared by ED provider for discharge. Discharge instructions reviewed with patient/family using "Teach Back"; verbalized understanding of medication education and administration, and information about follow-up care. Denies further concerns. ° °

## 2023-10-19 NOTE — OB Triage Note (Signed)
Pt states she started leaking fluid yesterday, noticing it when she coughed. Reports clear fluid. Denies increase in ctx. Denies vaginal bleeding. Reports positive fetal movemwent. Elaina Hoops

## 2023-10-19 NOTE — ED Notes (Signed)
PT being discharged to LDR is now co abd pain and fluid leaking pt is [redacted] weeks pregnant.

## 2023-10-19 NOTE — ED Provider Notes (Signed)
   [facility] Provider Note    None    (approximate)   History   Shortness of Breath   HPI  Brianna Grimes is a 27 y.o. female who is currently [redacted] weeks pregnant presents emergency department complaining of URI symptoms and leaking from the vaginal area.  Patient states she is having a lot of pressure and pain in the lower abdomen.  No bleeding.      Physical Exam   Triage Vital Signs: ED Triage Vitals  Encounter Vitals Group     BP 10/19/23 1025 126/66     Systolic BP Percentile --      Diastolic BP Percentile --      Pulse Rate 10/19/23 1025 (!) 101     Resp 10/19/23 1025 18     Temp 10/19/23 1025 98.7 F (37.1 C)     Temp Source 10/19/23 1025 Oral     SpO2 --      Weight --      Height --      Head Circumference --      Peak Flow --      Pain Score 10/19/23 1024 0     Pain Loc --      Pain Education --      Exclude from Growth Chart --     Most recent vital signs: Vitals:   10/19/23 1025  BP: 126/66  Pulse: (!) 101  Resp: 18  Temp: 98.7 F (37.1 C)     General: Awake, no distress.   CV:  Good peripheral perfusion. regular rate and  rhythm Resp:  Normal effort. Lungs cta Abd:  No distention.   Other:      ED Results / Procedures / Treatments   Labs (all labs ordered are listed, but only abnormal results are displayed) Labs Reviewed  SARS CORONAVIRUS 2 BY RT PCR  RESP PANEL BY RT-PCR (RSV, FLU A&B, COVID)  [id number]     EKG     RADIOLOGY     PROCEDURES:   Procedures   MEDICATIONS ORDERED IN ED: Medications - No data to display   IMPRESSION / MDM / ASSESSMENT AND PLAN / ED COURSE  I reviewed the triage vital signs and the nursing notes.                              Differential diagnosis includes, but is not limited to, COVID, influenza, RSV, UTI, contractions  Patient's presentation is most consistent with acute illness / injury with system symptoms.   Respiratory panel is  reassuring  Patient was sent to L&D for contractions      FINAL CLINICAL IMPRESSION(S) / ED DIAGNOSES   Final diagnoses:  Viral upper respiratory tract infection     Rx / DC Orders   ED Discharge Orders     None        Note:  This document was prepared using Dragon voice recognition software and may include unintentional dictation errors.    Faythe Ghee, PA-C 10/19/23 1313    Corena Herter, MD 10/19/23 [phone]

## 2023-10-19 NOTE — OB Triage Note (Signed)
Discharge home. Left floor ambulatory. Brianna Grimes  

## 2023-10-19 NOTE — Discharge Summary (Cosign Needed Addendum)
Brianna Grimes is a 27 y.o. female. She is at [redacted]w[redacted]d gestation. Patient's last menstrual period was 09/19/2022. Estimated Date of Delivery: 01/01/24  Prenatal care site: [facility]  Chief complaint: Abdominal pain and leakage of fluid  HPI: Christalle presents to L&D with concerns of abdominal pain when coughing and leakage of fluid. She rates her abdominal pain a 0/10 currently. She states she feel discomfort when coughing. Reports she experiences fluid expelled from the vagina when coughing. Describes the fluid to be clear. She states she first noticed this around noon yesterday. "I just want to make sure my water did not break." Denies vaginal bleeding and contractions.   Factors complicating pregnancy: BMI 40 or more-Obesity Class III H/o marijuana use  Rubella non immune H/o anxiety H/o postpartum depression  S: Resting comfortably. No contractions, no vaginal bleeding, and no leakage of fluid. Endorses active fetal movement.   Maternal Medical History:  Past Medical Hx:  has a past medical history of Asthma and Vitamin D deficiency.    Past Surgical Hx:  has a past surgical history that includes No past surgeries.   No Known Allergies   Prior to Admission medications   Medication Sig Start Date End Date Taking? Authorizing Provider  Prenatal Vit-Fe Fumarate-FA (MULTIVITAMIN-PRENATAL) 27-0.8 MG TABS tablet Take 1 tablet by mouth daily at 12 noon.   Yes [provider]  acetaminophen (TYLENOL) 325 MG tablet Take 2 tablets (650 mg total) by mouth every 4 (four) hours as needed for mild pain, moderate pain or headache. 10/01/18   Genia Del, CNM  ergocalciferol (VITAMIN D2) 1.25 MG (50000 UT) capsule Take 50,000 Units by mouth once a week. Patient not taking: Reported on 08/08/2023 07/07/20   [provider]  ferrous sulfate 325 (65 FE) MG tablet Take 1 tablet (325 mg total) by mouth daily with breakfast. Patient not taking: Reported on 08/08/2023  07/30/20   Genia Del, CNM  ibuprofen (ADVIL) 600 MG tablet Take 1 tablet (600 mg total) by mouth every 6 (six) hours as needed for mild pain, moderate pain or cramping. Patient not taking: Reported on 08/08/2023 07/30/20   Genia Del, CNM  meloxicam (MOBIC) 15 MG tablet Take 1 tablet (15 mg total) by mouth daily. Patient not taking: Reported on 08/08/2023 02/22/21   Cuthriell, Delorise Royals, PA-C  methocarbamol (ROBAXIN) 500 MG tablet Take 1 tablet (500 mg total) by mouth 4 (four) times daily. Patient not taking: Reported on 08/08/2023 02/22/21   Cuthriell, Delorise Royals, PA-C  oxyCODONE-acetaminophen (PERCOCET/ROXICET) 5-325 MG tablet Take 1 tablet by mouth every 6 (six) hours as needed for severe pain. Patient not taking: Reported on 08/08/2023 02/22/21   Cuthriell, Delorise Royals, PA-C    Social History: She  reports that she has never smoked. She has never used smokeless tobacco. She reports that she does not currently use alcohol. She reports that she does not currently use drugs after having used the following drugs: Marijuana.  Family History: family history includes Bipolar disorder in her father, mother, and paternal grandmother; Breast cancer in her maternal aunt and maternal aunt; Diabetes in her maternal aunt and mother; Hypertension in her brother and father.  No history of gyn cancers  Review of Systems: A full review of systems was performed and negative except as noted in the HPI.    O:  BP 114/68   Pulse 95   Temp 98.7 F (37.1 C) (Oral)   Resp (!) 22   Ht 5\' 8"  (1.727 m)  Wt (!) 140.2 kg   LMP 09/19/2022   BMI 46.98 kg/m   Today's Vitals   10/19/23 1404 10/19/23 1419 10/19/23 1434 10/19/23 1449  BP: (!) 146/70 133/76 (!) 145/57 114/68  Pulse: 96 96 93 95  Resp:      Temp:      TempSrc:      Weight:      Height:      PainSc:       Body mass index is 46.98 kg/m.    Results for orders placed or performed during the hospital encounter of 10/19/23 (from the  past 48 hours)  Wet prep, genital   Collection Time: 10/19/23 12:47 PM   Specimen: Vaginal  Result Value Ref Range   Yeast Wet Prep HPF POC NONE SEEN NONE SEEN   Trich, Wet Prep NONE SEEN NONE SEEN   Clue Cells Wet Prep HPF POC NONE SEEN NONE SEEN   WBC, Wet Prep HPF POC >=10 (A) <10   Sperm NONE SEEN   Urinalysis, Complete w Microscopic -Urine, Clean Catch   Collection Time: 10/19/23 12:47 PM  Result Value Ref Range   Color, Urine YELLOW (A) YELLOW   APPearance HAZY (A) CLEAR   Specific Gravity, Urine 1.026 1.005 - 1.030   pH 6.0 5.0 - 8.0   Glucose, UA >=500 (A) NEGATIVE mg/dL   Hgb urine dipstick MODERATE (A) NEGATIVE   Bilirubin Urine NEGATIVE NEGATIVE   Ketones, ur NEGATIVE NEGATIVE mg/dL   Protein, ur 30 (A) NEGATIVE mg/dL   Nitrite NEGATIVE NEGATIVE   Leukocytes,Ua TRACE (A) NEGATIVE   RBC / HPF 11-20 0 - 5 RBC/hpf   WBC, UA 6-10 0 - 5 WBC/hpf   Bacteria, UA RARE (A) NONE SEEN   Squamous Epithelial / HPF 11-20 0 - 5 /HPF   Mucus PRESENT   Rupture of Membrane (ROM) Plus   Collection Time: 10/19/23 12:47 PM  Result Value Ref Range   Rom Plus NEGATIVE   Fetal fibronectin   Collection Time: 10/19/23 12:47 PM  Result Value Ref Range   Fetal Fibronectin NEGATIVE NEGATIVE  Protein / creatinine ratio, urine   Collection Time: 10/19/23 12:47 PM  Result Value Ref Range   Creatinine, Urine 167 mg/dL   Total Protein, Urine 30 mg/dL   Protein Creatinine Ratio 0.18 (H) 0.00 - 0.15 mg/mg[Cre]  Comprehensive metabolic panel   Collection Time: 10/19/23  2:44 PM  Result Value Ref Range   Sodium 135 135 - 145 mmol/L   Potassium 3.4 (L) 3.5 - 5.1 mmol/L   Chloride 106 98 - 111 mmol/L   CO2 19 (L) 22 - 32 mmol/L   Glucose, Bld 87 70 - 99 mg/dL   BUN 6 6 - 20 mg/dL   Creatinine, Ser 7.84 (L) 0.44 - 1.00 mg/dL   Calcium 7.9 (L) 8.9 - 10.3 mg/dL   Total Protein 6.6 6.5 - 8.1 g/dL   Albumin 2.8 (L) 3.5 - 5.0 g/dL   AST 15 15 - 41 U/L   ALT 14 0 - 44 U/L   Alkaline Phosphatase  64 38 - 126 U/L   Total Bilirubin 0.3 <1.2 mg/dL   GFR, Estimated >69 >62 mL/min   Anion gap 10 5 - 15  CBC   Collection Time: 10/19/23  4:09 PM  Result Value Ref Range   WBC 6.7 4.0 - 10.5 K/uL   RBC 3.96 3.87 - 5.11 MIL/uL   Hemoglobin 11.3 (L) 12.0 - 15.0 g/dL   HCT 95.2 (L) 84.1 -  46.0 %   MCV 87.4 80.0 - 100.0 fL   MCH 28.5 26.0 - 34.0 pg   MCHC 32.7 30.0 - 36.0 g/dL   RDW 16.1 09.6 - 04.5 %   Platelets 166 150 - 400 K/uL   nRBC 0.0 0.0 - 0.2 %  Results for orders placed or performed during the hospital encounter of 10/19/23 (from the past 48 hours)  SARS Coronavirus 2 by RT PCR (hospital order, performed in [facility] hospital lab) *cepheid single result test* Anterior Nasal Swab   Collection Time: 10/19/23 10:28 AM   Specimen: Anterior Nasal Swab  Result Value Ref Range   SARS Coronavirus 2 by RT PCR NEGATIVE NEGATIVE  Resp panel by RT-PCR (RSV, Flu A&B, Covid) Anterior Nasal Swab   Collection Time: 10/19/23 10:28 AM   Specimen: Anterior Nasal Swab  Result Value Ref Range   SARS Coronavirus 2 by RT PCR NEGATIVE NEGATIVE   Influenza A by PCR NEGATIVE NEGATIVE   Influenza B by PCR NEGATIVE NEGATIVE   Resp Syncytial Virus by PCR NEGATIVE NEGATIVE     Physical Exam:  Constitutional: NAD, AAOx3  HE/ENT: extraocular movements grossly intact, moist mucous membranes CV: RRR PULM: nl respiratory effort, CTABL Abd: gravid, non-tender, non-distended, soft  Ext: Non-tender, Nonedmeatous Psych: mood appropriate, speech normal Pelvic : No vaginal bleeding and no pooling noted. Moderate amount of normal white physiologic discharge.  SVE: Dilation: Closed Exam by:: Lamya Lausch, CNM   NST:  Baseline: 130 bpm Variability: moderate Accels: Present Decels: none Toco: occasional x1, mild to palpation, pt reports not feeling contraction  Category: I RESULTS:  A NST procedure was performed with FHR monitoring and a normal baseline established, appropriate time of 20-40 minutes of  evaluation, and accels >2 seen w 15x15 characteristics.  Results show a REACTIVE NST   Assessment: 27 y.o. [redacted]w[redacted]d here for antenatal surveillance during pregnancy.  Principle diagnosis:  The primary encounter diagnosis was Amniotic fluid leaking. Diagnoses of Stress incontinence during pregnancy, Abdominal pain during pregnancy in third trimester, and Elevated blood pressure affecting pregnancy in third trimester, antepartum were also pertinent to this visit.   Plan: Labor: Not present. Cervical exam revealed cervix to be closed reassuring against preterm labor. Abdomen nontender and soft during palpation. One uterine contraction noted on FHR tracing; Patient reports she does not feel contraction.  Fetal Wellbeing: Reassuring Cat 1 tracing. Reactive NST  D/c home stable, strict return precautions reviewed, follow-up as needed.   R/o leakage of fluid  - Ffn collected; Results: Negative (10/19/2023)  - ROM Plus collected; Results: Negative (10/19/2023)  - Fern test collected; Results: Negative (10/19/2023)  - Speculum exam revealed no vaginal bleeding and no pooling reassuring against rupture of membranes.   2. Abdominal Pain - Tylenol 1,000 mg PO offered and given. Currently rates pain 0/10. - Wet prep collected; Results: Negative (10/19/2023) - Urinalysis collected (10/19/2023); Results as follows:   - Protein, ur--> 30  - Bacteria--> Rare  - Nitrites--> Negative   - Glucose--> >=500  3. Stress incontinence  - Recommended to seek pelvic floor therapy  4. Elevated Blood Pressures  - MRBP ranging from 156-133/83-66  - Prot + CreaU and CMP collected on 10/09/2023; Results as follows:   - Protein Creatinine Ratio: 0.18   - AST/ALT: 15/14   - Platelets: 166   - WBC: 6.7  - Denies signs and symptoms of Pre-E.  - Patient instructed to log blood pressures BID and bring log to prenatal visit on 12/30 for review.   -----  Roney Jaffe, CNM Certified Nurse Midwife [facility]  [facility]N [facility]

## 2023-10-19 NOTE — Discharge Instructions (Signed)
Please START logging your blood pressure TWICE A DAY (in the AM and PM) and bring log to next prenatal visit.

## 2023-10-19 NOTE — ED Triage Notes (Signed)
Pt states she is [redacted] weeks pregnant and having a cough, sob and vaginal discharge of clear fluid.

## 2023-10-24 NOTE — L&D Delivery Note (Signed)
 Delivery Note  First Stage: Labor onset: 1015 Augmentation : pitocin, AROM Analgesia /Anesthesia intrapartum: epidural AROM at 1350  Second Stage: Complete dilation at 1730 Onset of pushing at 1732 FHR second stage n/a, imminent delivery  Delivery of a viable female infant 12/27/2023 at 1733 by Donato Schultz, CNM. delivery of fetal head in OA position with restitution to LOA. Loose nuchal cord, delivered through; Left nuchal hand present.  Anterior then posterior shoulders delivered easily with gentle downward traction. Baby placed on mom's chest, and attended to by peds.  Cord double clamped after cessation of pulsation, cut by FOB  Third Stage: Placenta delivered Brianna Grimes intact with 3 VC @ 1738 Placenta disposition: discarded Uterine tone firm / bleeding light  No laceration identified  Anesthesia for repair: n/a Repair : none needed Est. Blood Loss (mL):  Complications: none  Mom to postpartum.  Baby to Couplet care / Skin to Skin.  Newborn: Birth Weight: TBD, infant skin-to-skin  Apgar Scores: 8, 9 Feeding planned: breast and bottle

## 2023-10-31 ENCOUNTER — Other Ambulatory Visit: Payer: Self-pay

## 2023-10-31 ENCOUNTER — Ambulatory Visit: Payer: Medicaid Other | Attending: Obstetrics and Gynecology

## 2023-10-31 DIAGNOSIS — O99213 Obesity complicating pregnancy, third trimester: Secondary | ICD-10-CM | POA: Insufficient documentation

## 2023-10-31 DIAGNOSIS — O35EXX0 Maternal care for other (suspected) fetal abnormality and damage, fetal genitourinary anomalies, not applicable or unspecified: Secondary | ICD-10-CM | POA: Diagnosis not present

## 2023-10-31 DIAGNOSIS — E669 Obesity, unspecified: Secondary | ICD-10-CM

## 2023-10-31 DIAGNOSIS — Z3A31 31 weeks gestation of pregnancy: Secondary | ICD-10-CM | POA: Diagnosis not present

## 2023-11-05 ENCOUNTER — Other Ambulatory Visit: Payer: Self-pay | Admitting: Obstetrics and Gynecology

## 2023-11-05 DIAGNOSIS — O99213 Obesity complicating pregnancy, third trimester: Secondary | ICD-10-CM

## 2023-11-26 ENCOUNTER — Ambulatory Visit: Payer: Medicaid Other | Attending: Maternal & Fetal Medicine

## 2023-11-26 ENCOUNTER — Other Ambulatory Visit: Payer: Self-pay

## 2023-11-26 DIAGNOSIS — O352XX0 Maternal care for (suspected) hereditary disease in fetus, not applicable or unspecified: Secondary | ICD-10-CM

## 2023-11-26 DIAGNOSIS — Z87798 Personal history of other (corrected) congenital malformations: Secondary | ICD-10-CM | POA: Insufficient documentation

## 2023-11-26 DIAGNOSIS — O283 Abnormal ultrasonic finding on antenatal screening of mother: Secondary | ICD-10-CM | POA: Diagnosis not present

## 2023-11-26 DIAGNOSIS — Z3A34 34 weeks gestation of pregnancy: Secondary | ICD-10-CM | POA: Diagnosis not present

## 2023-11-26 DIAGNOSIS — O35EXX0 Maternal care for other (suspected) fetal abnormality and damage, fetal genitourinary anomalies, not applicable or unspecified: Secondary | ICD-10-CM | POA: Diagnosis not present

## 2023-11-26 DIAGNOSIS — E669 Obesity, unspecified: Secondary | ICD-10-CM

## 2023-11-26 DIAGNOSIS — O99213 Obesity complicating pregnancy, third trimester: Secondary | ICD-10-CM | POA: Insufficient documentation

## 2023-12-03 ENCOUNTER — Other Ambulatory Visit: Payer: Medicaid Other

## 2023-12-04 LAB — OB RESULTS CONSOLE GC/CHLAMYDIA
Chlamydia: NEGATIVE
Neisseria Gonorrhea: NEGATIVE

## 2023-12-04 LAB — OB RESULTS CONSOLE HIV ANTIBODY (ROUTINE TESTING): HIV: NONREACTIVE

## 2023-12-04 LAB — OB RESULTS CONSOLE GBS: GBS: POSITIVE

## 2023-12-04 LAB — OB RESULTS CONSOLE RPR: RPR: NONREACTIVE

## 2023-12-10 ENCOUNTER — Ambulatory Visit: Payer: Medicaid Other | Attending: Obstetrics | Admitting: Obstetrics

## 2023-12-10 ENCOUNTER — Ambulatory Visit: Payer: Medicaid Other | Attending: Obstetrics

## 2023-12-10 DIAGNOSIS — O35EXX0 Maternal care for other (suspected) fetal abnormality and damage, fetal genitourinary anomalies, not applicable or unspecified: Secondary | ICD-10-CM

## 2023-12-10 DIAGNOSIS — O283 Abnormal ultrasonic finding on antenatal screening of mother: Secondary | ICD-10-CM | POA: Diagnosis not present

## 2023-12-10 DIAGNOSIS — E669 Obesity, unspecified: Secondary | ICD-10-CM | POA: Diagnosis not present

## 2023-12-10 DIAGNOSIS — Z87798 Personal history of other (corrected) congenital malformations: Secondary | ICD-10-CM | POA: Insufficient documentation

## 2023-12-10 DIAGNOSIS — O352XX0 Maternal care for (suspected) hereditary disease in fetus, not applicable or unspecified: Secondary | ICD-10-CM | POA: Diagnosis not present

## 2023-12-10 DIAGNOSIS — Z3A36 36 weeks gestation of pregnancy: Secondary | ICD-10-CM | POA: Diagnosis not present

## 2023-12-10 DIAGNOSIS — O99213 Obesity complicating pregnancy, third trimester: Secondary | ICD-10-CM | POA: Insufficient documentation

## 2023-12-10 DIAGNOSIS — Z362 Encounter for other antenatal screening follow-up: Secondary | ICD-10-CM | POA: Insufficient documentation

## 2023-12-10 NOTE — Progress Notes (Signed)
MFM Consult Note  Brianna Grimes is currently at 36 weeks and 6 days.  She was seen for a BPP due to maternal obesity with a BMI of 45.  She denies any problems since her last exam and has screened negative for gestational diabetes.    A biophysical profile performed today was 8/8.    The AFI was 10.6 cm (within normal limits).   The left renal pelvis measured 0.6 to 0.7 cm dilated.  This is most likely a normal finding at her current gestational age.   The patient was advised to notify her pediatrician regarding pyelectasis that has been noted on her prenatal ultrasound exams.  Her pediatrician will order additional imaging studies of the baby's kidneys after birth.    She will return in 1 week for another BPP.    Due to maternal obesity, delivery should be considered at around 39 weeks.  The patient stated that all of her questions were answered today.  A total of 20 minutes was spent counseling and coordinating the care for this patient.  Greater than 50% of the time was spent in direct face-to-face contact.

## 2023-12-11 DIAGNOSIS — O99210 Obesity complicating pregnancy, unspecified trimester: Secondary | ICD-10-CM | POA: Insufficient documentation

## 2023-12-12 ENCOUNTER — Other Ambulatory Visit: Payer: Self-pay | Admitting: Obstetrics

## 2023-12-12 DIAGNOSIS — R638 Other symptoms and signs concerning food and fluid intake: Secondary | ICD-10-CM

## 2023-12-17 ENCOUNTER — Ambulatory Visit: Payer: Medicaid Other | Attending: Obstetrics

## 2023-12-17 DIAGNOSIS — O99210 Obesity complicating pregnancy, unspecified trimester: Secondary | ICD-10-CM

## 2023-12-24 ENCOUNTER — Ambulatory Visit: Payer: Medicaid Other | Attending: Obstetrics and Gynecology

## 2023-12-27 ENCOUNTER — Inpatient Hospital Stay: Admitting: Anesthesiology

## 2023-12-27 ENCOUNTER — Inpatient Hospital Stay
Admission: RE | Admit: 2023-12-27 | Discharge: 2023-12-28 | DRG: 807 | Disposition: A | Discharge status: Home / Self Care | Source: Ambulatory Visit | Attending: Obstetrics and Gynecology | Admitting: Obstetrics and Gynecology

## 2023-12-27 ENCOUNTER — Encounter: Payer: Self-pay | Admitting: Obstetrics and Gynecology

## 2023-12-27 ENCOUNTER — Other Ambulatory Visit: Payer: Self-pay

## 2023-12-27 DIAGNOSIS — F419 Anxiety disorder, unspecified: Secondary | ICD-10-CM

## 2023-12-27 DIAGNOSIS — O4290 Premature rupture of membranes, unspecified as to length of time between rupture and onset of labor, unspecified weeks of gestation: Principal | ICD-10-CM

## 2023-12-27 DIAGNOSIS — O99214 Obesity complicating childbirth: Principal | ICD-10-CM | POA: Diagnosis present

## 2023-12-27 DIAGNOSIS — Z3490 Encounter for supervision of normal pregnancy, unspecified, unspecified trimester: Secondary | ICD-10-CM | POA: Diagnosis present

## 2023-12-27 DIAGNOSIS — Z833 Family history of diabetes mellitus: Secondary | ICD-10-CM | POA: Diagnosis not present

## 2023-12-27 DIAGNOSIS — O99210 Obesity complicating pregnancy, unspecified trimester: Secondary | ICD-10-CM | POA: Diagnosis present

## 2023-12-27 DIAGNOSIS — Z8659 Personal history of other mental and behavioral disorders: Secondary | ICD-10-CM

## 2023-12-27 DIAGNOSIS — O99891 Other specified diseases and conditions complicating pregnancy: Secondary | ICD-10-CM

## 2023-12-27 DIAGNOSIS — O99824 Streptococcus B carrier state complicating childbirth: Secondary | ICD-10-CM | POA: Diagnosis present

## 2023-12-27 DIAGNOSIS — Z3A39 39 weeks gestation of pregnancy: Secondary | ICD-10-CM | POA: Diagnosis not present

## 2023-12-27 DIAGNOSIS — Z8249 Family history of ischemic heart disease and other diseases of the circulatory system: Secondary | ICD-10-CM | POA: Diagnosis not present

## 2023-12-27 DIAGNOSIS — R7309 Other abnormal glucose: Secondary | ICD-10-CM

## 2023-12-27 DIAGNOSIS — E66813 Obesity, class 3: Secondary | ICD-10-CM | POA: Diagnosis present

## 2023-12-27 DIAGNOSIS — N393 Stress incontinence (female) (male): Secondary | ICD-10-CM

## 2023-12-27 DIAGNOSIS — O99892 Other specified diseases and conditions complicating childbirth: Secondary | ICD-10-CM

## 2023-12-27 DIAGNOSIS — Z2839 Other underimmunization status: Secondary | ICD-10-CM

## 2023-12-27 DIAGNOSIS — B951 Streptococcus, group B, as the cause of diseases classified elsewhere: Secondary | ICD-10-CM

## 2023-12-27 DIAGNOSIS — Z23 Encounter for immunization: Secondary | ICD-10-CM

## 2023-12-27 DIAGNOSIS — O09299 Supervision of pregnancy with other poor reproductive or obstetric history, unspecified trimester: Secondary | ICD-10-CM

## 2023-12-27 LAB — CBC
HCT: 35.2 % — ABNORMAL LOW (ref 36.0–46.0)
Hemoglobin: 11.3 g/dL — ABNORMAL LOW (ref 12.0–15.0)
MCH: 27 pg (ref 26.0–34.0)
MCHC: 32.1 g/dL (ref 30.0–36.0)
MCV: 84 fL (ref 80.0–100.0)
Platelets: 179 10*3/uL (ref 150–400)
RBC: 4.19 MIL/uL (ref 3.87–5.11)
RDW: 16.4 % — ABNORMAL HIGH (ref 11.5–15.5)
WBC: 8.2 10*3/uL (ref 4.0–10.5)
nRBC: 0 % (ref 0.0–0.2)

## 2023-12-27 LAB — TYPE AND SCREEN
ABO/RH(D): A POS
Antibody Screen: NEGATIVE

## 2023-12-27 MED ORDER — AMMONIA AROMATIC IN INHA
RESPIRATORY_TRACT | Status: AC
  Filled 2023-12-27: qty 10

## 2023-12-27 MED ORDER — LACTATED RINGERS IV SOLN: INTRAVENOUS | Status: DC

## 2023-12-27 MED ORDER — DIPHENHYDRAMINE HCL 25 MG PO CAPS: 25.0000 mg | ORAL_CAPSULE | Freq: Four times a day (QID) | ORAL | Status: DC | PRN

## 2023-12-27 MED ORDER — IBUPROFEN 600 MG PO TABS
600.0000 mg | ORAL_TABLET | Freq: Four times a day (QID) | ORAL | Status: DC
  Administered 2023-12-27 – 2023-12-28 (×4): 600 mg via ORAL
  Filled 2023-12-27 (×4): qty 1

## 2023-12-27 MED ORDER — WITCH HAZEL-GLYCERIN EX PADS
1.0000 | MEDICATED_PAD | CUTANEOUS | Status: DC | PRN
  Administered 2023-12-27: 1 via TOPICAL
  Filled 2023-12-27: qty 100

## 2023-12-27 MED ORDER — ACETAMINOPHEN 325 MG PO TABS
650.0000 mg | ORAL_TABLET | ORAL | Status: DC | PRN
  Administered 2023-12-27: 650 mg via ORAL
  Filled 2023-12-27: qty 2

## 2023-12-27 MED ORDER — OXYTOCIN-SODIUM CHLORIDE 30-0.9 UT/500ML-% IV SOLN
1.0000 m[IU]/min | INTRAVENOUS | Status: DC
  Administered 2023-12-27: 2 m[IU]/min via INTRAVENOUS

## 2023-12-27 MED ORDER — FENTANYL-BUPIVACAINE-NACL 0.5-0.125-0.9 MG/250ML-% EP SOLN: 12.0000 mL/h | EPIDURAL | Status: DC | PRN

## 2023-12-27 MED ORDER — LIDOCAINE HCL (PF) 1 % IJ SOLN
INTRAMUSCULAR | Status: AC
  Filled 2023-12-27: qty 30

## 2023-12-27 MED ORDER — SIMETHICONE 80 MG PO CHEW: 80.0000 mg | CHEWABLE_TABLET | ORAL | Status: DC | PRN

## 2023-12-27 MED ORDER — PHENYLEPHRINE 80 MCG/ML (10ML) SYRINGE FOR IV PUSH (FOR BLOOD PRESSURE SUPPORT): 80.0000 ug | PREFILLED_SYRINGE | INTRAVENOUS | Status: DC | PRN

## 2023-12-27 MED ORDER — PENICILLIN G POT IN DEXTROSE 60000 UNIT/ML IV SOLN
3.0000 10*6.[IU] | INTRAVENOUS | Status: DC
  Administered 2023-12-27: 3 10*6.[IU] via INTRAVENOUS
  Filled 2023-12-27: qty 50

## 2023-12-27 MED ORDER — LIDOCAINE-EPINEPHRINE (PF) 1.5 %-1:200000 IJ SOLN
INTRAMUSCULAR | Status: DC | PRN
  Administered 2023-12-27: 3 mL via EPIDURAL

## 2023-12-27 MED ORDER — ONDANSETRON HCL 4 MG/2ML IJ SOLN: 4.0000 mg | INTRAMUSCULAR | Status: DC | PRN

## 2023-12-27 MED ORDER — ONDANSETRON HCL 4 MG PO TABS: 4.0000 mg | ORAL_TABLET | ORAL | Status: DC | PRN

## 2023-12-27 MED ORDER — TERBUTALINE SULFATE 1 MG/ML IJ SOLN: 0.2500 mg | Freq: Once | INTRAMUSCULAR | Status: DC | PRN

## 2023-12-27 MED ORDER — LACTATED RINGERS IV SOLN: 500.0000 mL | INTRAVENOUS | Status: DC | PRN

## 2023-12-27 MED ORDER — VARICELLA VIRUS VACCINE LIVE 1350 PFU/0.5ML IJ SUSR
0.5000 mL | INTRAMUSCULAR | Status: AC | PRN
  Administered 2023-12-28: 0.5 mL via SUBCUTANEOUS
  Filled 2023-12-27: qty 0.5

## 2023-12-27 MED ORDER — ACETAMINOPHEN 325 MG PO TABS: 650.0000 mg | ORAL_TABLET | ORAL | Status: DC | PRN

## 2023-12-27 MED ORDER — SENNOSIDES-DOCUSATE SODIUM 8.6-50 MG PO TABS
2.0000 | ORAL_TABLET | Freq: Every day | ORAL | Status: DC
  Administered 2023-12-28: 2 via ORAL
  Filled 2023-12-27: qty 2

## 2023-12-27 MED ORDER — OXYTOCIN-SODIUM CHLORIDE 30-0.9 UT/500ML-% IV SOLN
2.5000 [IU]/h | INTRAVENOUS | Status: DC
  Administered 2023-12-27: 2.5 [IU]/h via INTRAVENOUS

## 2023-12-27 MED ORDER — FENTANYL-BUPIVACAINE-NACL 0.5-0.125-0.9 MG/250ML-% EP SOLN
EPIDURAL | Status: DC | PRN
  Administered 2023-12-27: 12 mL/h via EPIDURAL

## 2023-12-27 MED ORDER — EPHEDRINE 5 MG/ML INJ: 10.0000 mg | INTRAVENOUS | Status: DC | PRN

## 2023-12-27 MED ORDER — LIDOCAINE HCL (PF) 1 % IJ SOLN
INTRAMUSCULAR | Status: DC | PRN
Start: 2023-12-27 — End: 2023-12-27
  Administered 2023-12-27: 2 mL
  Administered 2023-12-27: 3 mL

## 2023-12-27 MED ORDER — MISOPROSTOL 25 MCG QUARTER TABLET
25.0000 ug | ORAL_TABLET | ORAL | Status: DC
  Filled 2023-12-27: qty 1

## 2023-12-27 MED ORDER — PRENATAL MULTIVITAMIN CH
1.0000 | ORAL_TABLET | Freq: Every day | ORAL | Status: DC
  Administered 2023-12-28: 1 via ORAL
  Filled 2023-12-27: qty 1

## 2023-12-27 MED ORDER — SOD CITRATE-CITRIC ACID 500-334 MG/5ML PO SOLN: 30.0000 mL | ORAL | Status: DC | PRN

## 2023-12-27 MED ORDER — LIDOCAINE HCL (PF) 1 % IJ SOLN: 30.0000 mL | INTRAMUSCULAR | Status: DC | PRN

## 2023-12-27 MED ORDER — OXYTOCIN 10 UNIT/ML IJ SOLN
INTRAMUSCULAR | Status: AC
  Filled 2023-12-27: qty 2

## 2023-12-27 MED ORDER — ONDANSETRON HCL 4 MG/2ML IJ SOLN: 4.0000 mg | Freq: Four times a day (QID) | INTRAMUSCULAR | Status: DC | PRN

## 2023-12-27 MED ORDER — MISOPROSTOL 200 MCG PO TABS
ORAL_TABLET | ORAL | Status: AC
Start: 2023-12-27 — End: 2023-12-27
  Filled 2023-12-27: qty 4

## 2023-12-27 MED ORDER — FENTANYL CITRATE (PF) 100 MCG/2ML IJ SOLN
50.0000 ug | INTRAMUSCULAR | Status: DC | PRN
  Administered 2023-12-27: 100 ug via INTRAVENOUS
  Filled 2023-12-27: qty 2

## 2023-12-27 MED ORDER — SODIUM CHLORIDE 0.9 % IV SOLN
5.0000 10*6.[IU] | Freq: Once | INTRAVENOUS | Status: AC
  Administered 2023-12-27: 5 10*6.[IU] via INTRAVENOUS
  Filled 2023-12-27: qty 5

## 2023-12-27 MED ORDER — ZOLPIDEM TARTRATE 5 MG PO TABS: 5.0000 mg | ORAL_TABLET | Freq: Every evening | ORAL | Status: DC | PRN

## 2023-12-27 MED ORDER — DIPHENHYDRAMINE HCL 50 MG/ML IJ SOLN: 12.5000 mg | INTRAMUSCULAR | Status: DC | PRN

## 2023-12-27 MED ORDER — OXYTOCIN BOLUS FROM INFUSION
333.0000 mL | Freq: Once | INTRAVENOUS | Status: AC
  Administered 2023-12-27: 333 mL via INTRAVENOUS

## 2023-12-27 MED ORDER — COCONUT OIL OIL
1.0000 | TOPICAL_OIL | Status: DC | PRN
  Filled 2023-12-27: qty 7.5

## 2023-12-27 MED ORDER — BENZOCAINE-MENTHOL 20-0.5 % EX AERO
1.0000 | INHALATION_SPRAY | CUTANEOUS | Status: DC | PRN
  Administered 2023-12-27: 1 via TOPICAL
  Filled 2023-12-27: qty 56

## 2023-12-27 MED ORDER — DIBUCAINE (PERIANAL) 1 % EX OINT
1.0000 | TOPICAL_OINTMENT | CUTANEOUS | Status: DC | PRN
  Administered 2023-12-27: 1 via RECTAL
  Filled 2023-12-27: qty 28

## 2023-12-27 MED ORDER — FENTANYL-BUPIVACAINE-NACL 0.5-0.125-0.9 MG/250ML-% EP SOLN
EPIDURAL | Status: AC
  Filled 2023-12-27: qty 250

## 2023-12-27 MED ORDER — LACTATED RINGERS IV SOLN
500.0000 mL | Freq: Once | INTRAVENOUS | Status: AC
  Administered 2023-12-27: 500 mL via INTRAVENOUS

## 2023-12-27 MED ORDER — TETANUS-DIPHTH-ACELL PERTUSSIS 5-2.5-18.5 LF-MCG/0.5 IM SUSY
0.5000 mL | PREFILLED_SYRINGE | Freq: Once | INTRAMUSCULAR | Status: DC
  Filled 2023-12-27: qty 0.5

## 2023-12-27 MED ORDER — OXYTOCIN-SODIUM CHLORIDE 30-0.9 UT/500ML-% IV SOLN
INTRAVENOUS | Status: AC
  Filled 2023-12-27: qty 500

## 2023-12-27 MED ORDER — MEASLES, MUMPS & RUBELLA VAC IJ SOLR
0.5000 mL | Freq: Once | INTRAMUSCULAR | Status: AC
  Administered 2023-12-28: 0.5 mL via SUBCUTANEOUS
  Filled 2023-12-27 (×2): qty 0.5

## 2023-12-27 NOTE — Progress Notes (Signed)
 Labor Progress Note  Brianna Grimes is a 28 y.o. G3P2002 at [redacted]w[redacted]d by ultrasound admitted for induction of labor due to BMI >40.  Subjective: she reports she can start to feel her contractions  Objective: BP 129/65 (BP Location: Left Arm)   Pulse 75   Temp 98.4 F (36.9 C) (Oral)   Resp 18   Ht 5' 8.5" (1.74 m)   Wt (!) 145.2 kg   LMP 09/19/2022   BMI 47.95 kg/m  Notable VS details: reviewed  Fetal Assessment: FHT:  FHR: 125 bpm, variability: moderate,  accelerations:  Present,  decelerations:  Absent Category/reactivity:  Category I UC:   regular, every 4 minutes SVE:    Dilation: 5cm  Effacement: 80%  Station:  -1  Consistency: soft  Position: middle  Membrane status:AROM @ 1350 Amniotic color: clear  Labs: Lab Results  Component Value Date   WBC 8.2 12/27/2023   HGB 11.3 (L) 12/27/2023   HCT 35.2 (L) 12/27/2023   MCV 84.0 12/27/2023   PLT 179 12/27/2023    Assessment / Plan: Induction of labor due to BMI > 40,  progressing well on pitocin  Labor:  Progressing normally on pitocin, AROM with clear fluid Preeclampsia:  labs stable Fetal Wellbeing:  Category I Pain Control:  Labor support without medications I/D:   GBS positive, Treat with PCN , first dose 1011, second dose started 1341 Anticipated MOD:  NSVD  Janyce Llanos, CNM 12/27/2023, 1:56 PM

## 2023-12-27 NOTE — Anesthesia Procedure Notes (Signed)
 Epidural Patient location during procedure: OB Start time: 12/27/2023 3:34 PM End time: 12/27/2023 3:50 PM  Staffing Anesthesiologist: Foye Deer, MD Performed: anesthesiologist   Preanesthetic Checklist Completed: patient identified, IV checked, site marked, risks and benefits discussed, surgical consent, monitors and equipment checked, pre-op evaluation and timeout performed  Epidural Patient position: sitting Prep: ChloraPrep Patient monitoring: heart rate, continuous pulse ox and blood pressure Approach: midline Location: L3-L4 Injection technique: LOR saline  Needle:  Needle type: Tuohy  Needle gauge: 18 G Needle length: 9 cm Needle insertion depth: 7 cm Catheter type: closed end Catheter size: 20 Guage Catheter at skin depth: 12 cm Test dose: negative and 1.5% lidocaine with Epi 1:200 K  Assessment Events: blood not aspirated, no cerebrospinal fluid, injection not painful, no injection resistance and no paresthesia  Additional Notes Reason for block:procedure for pain

## 2023-12-27 NOTE — Anesthesia Preprocedure Evaluation (Signed)
 Anesthesia Evaluation  Patient identified by MRN, date of birth, ID band Patient awake    Reviewed: Allergy & Precautions, H&P , NPO status , Patient's Chart, lab work & pertinent test results  Airway Mallampati: II  TM Distance: >3 FB Neck ROM: full    Dental no notable dental hx.    Pulmonary neg pulmonary ROS   Pulmonary exam normal        Cardiovascular Exercise Tolerance: Good Normal cardiovascular exam  Sinus tachycardia   Neuro/Psych    GI/Hepatic negative GI ROS,,,  Endo/Other    Class 3 obesity  Renal/GU   negative genitourinary   Musculoskeletal   Abdominal  (+) + obese  Peds  Hematology negative hematology ROS (+)   Anesthesia Other Findings nsulin resistance syndrome  Past Medical History: No date: Asthma No date: Vitamin D deficiency  Past Surgical History: No date: NO PAST SURGERIES  BMI    Body Mass Index: 47.95 kg/m      Reproductive/Obstetrics (+) Pregnancy                              Anesthesia Physical Anesthesia Plan  ASA: 3  Anesthesia Plan: Epidural   Post-op Pain Management:    Induction:   PONV Risk Score and Plan:   Airway Management Planned:   Additional Equipment:   Intra-op Plan:   Post-operative Plan:   Informed Consent: I have reviewed the patients History and Physical, chart, labs and discussed the procedure including the risks, benefits and alternatives for the proposed anesthesia with the patient or authorized representative who has indicated his/her understanding and acceptance.       Plan Discussed with: Anesthesiologist and CRNA  Anesthesia Plan Comments:          Anesthesia Quick Evaluation

## 2023-12-27 NOTE — H&P (Signed)
 OB History & Physical   History of Present Illness:  Chief Complaint:   HPI:  Brianna Grimes is a 28 y.o. G58P2002 female at [redacted]w[redacted]d dated by [redacted]w[redacted]d [place].  She presents to L&D for scheduled IOL for BMI >40. She reports good fetal movement, denies contractions, denies vaginal bleeding.   Pregnancy Issues: 1. Obesity 2. Anxiety 3. Elevated 1hr GTT (passed 3hr GTT) 4. Hx of postpartum depression 5. GBS positive 6. Rubella and varicella non-immune 7. Hx of macrosomia (4310g)   Maternal Medical History:   Past Medical History:  Diagnosis Date   Asthma    Vitamin D deficiency     Past Surgical History:  Procedure Laterality Date   NO PAST SURGERIES      No Known Allergies  Prior to Admission medications   Medication Sig Start Date End Date Taking? Authorizing Provider  acetaminophen (TYLENOL) 325 MG tablet Take 2 tablets (650 mg total) by mouth every 4 (four) hours as needed for mild pain, moderate pain or headache. 10/01/18   Genia Del, CNM  Prenatal Vit-Fe Fumarate-FA (MULTIVITAMIN-PRENATAL) 27-0.8 MG TABS tablet Take 1 tablet by mouth daily at 12 noon.    [provider]     Prenatal care site: [facility]   Social History: She  reports that she has never smoked. She has never used smokeless tobacco. She reports that she does not currently use alcohol. She reports that she does not currently use drugs after having used the following drugs: Marijuana.  Family History: family history includes Bipolar disorder in her father, mother, and paternal grandmother; Breast cancer in her maternal aunt and maternal aunt; Diabetes in her maternal aunt and mother; Hypertension in her brother and father.   Review of Systems: A full review of systems was performed and negative except as noted in the HPI.     Physical Exam:  Vital Signs: BP 122/80   Pulse 84   LMP 09/19/2022  General: no acute distress.  HEENT: normocephalic, atraumatic Heart: regular rate  & rhythm.  No murmurs/rubs/gallops Lungs: clear to auscultation bilaterally, normal respiratory effort Abdomen: soft, gravid, non-tender;  EFW: 8lb Pelvic:   External: Normal external female genitalia  Cervix: Dilation: 4 / Effacement (%): 50 /   -1 station   Extremities: non-tender, symmetric, mild edema bilaterally.  DTRs: +2  Neurologic: Alert & oriented x 3.    Results for orders placed or performed during the hospital encounter of 12/27/23 (from the past 24 hours)  CBC     Status: Abnormal   Collection Time: 12/27/23  9:09 AM  Result Value Ref Range   WBC 8.2 4.0 - 10.5 K/uL   RBC 4.19 3.87 - 5.11 MIL/uL   Hemoglobin 11.3 (L) 12.0 - 15.0 g/dL   HCT 19.1 (L) 47.8 - 29.5 %   MCV 84.0 80.0 - 100.0 fL   MCH 27.0 26.0 - 34.0 pg   MCHC 32.1 30.0 - 36.0 g/dL   RDW 62.1 (H) 30.8 - 65.7 %   Platelets 179 150 - 400 K/uL   nRBC 0.0 0.0 - 0.2 %  Type and screen [facility]     Status: None (Preliminary result)   Collection Time: 12/27/23  9:09 AM  Result Value Ref Range   ABO/RH(D) PENDING    Antibody Screen PENDING    Sample Expiration      12/30/2023,2359 Performed at [facility], [address] [place], [place] [address]     Pertinent Results:  Prenatal Labs:  Blood type/Rh A positive  Antibody screen neg  Rubella Non-Immune  Varicella Non-Immune  RPR NR  HBsAg Neg  HIV NR  GC neg  Chlamydia neg  Genetic screening negative  1 hour GTT 143  3 hour GTT 97, 185, 102, 114   GBS Positive   FHT: 130bpm, moderate variability, accelerations present, no decelerations, Category I tracing TOCO: irregular, infrequent SVE:  Dilation: 4 / Effacement (%): 50 /      Cephalic by leopolds  [place] MFM FETAL BPP WO NON STRESS Result Date: 12/10/2023 ----------------------------------------------------------------------  OBSTETRICS REPORT                       (Signed Final 12/10/2023 04:12 pm)  ---------------------------------------------------------------------- Patient Info  ID #:       [id number]                          D.O.B.:  [birth year] (27 yrs)(F)  Name:       Brianna Grimes             Visit Date: 12/10/2023 03:37 pm ---------------------------------------------------------------------- Performed By  Attending:        Ma Rings MD         Ref. Address:     [facility]  Performed By:     Anabel Halon          [facility]                                     [facility] at                                                             [facility]  Referred By:      Tomasita Morrow CNM ---------------------------------------------------------------------- Orders  #  Description                           Code        Ordered By  1  [place] MFM FETAL BPP WO NON               76819.01    RAVI [facility]     STRESS ----------------------------------------------------------------------  #  Order #                     Accession #                Episode #  1  [id number]                   [id number]                 [id number] ---------------------------------------------------------------------- Indications  Obesity complicating pregnancy, third          O99.213  trimester (pregravid BMI 45)  Pyelectasis of fetus on prenatal ultrasound  O28.3  (resolved)  [redacted] weeks gestation of pregnancy                Z3A.36  History of congenital or genetic condition     Z87.798  Encounter for other antenatal screening        Z36.2  follow-up  Low risk NIPS ---------------------------------------------------------------------- Fetal Evaluation  Num Of Fetuses:         1  Fetal Heart Rate(bpm):  123  Cardiac Activity:       Observed  Presentation:           Cephalic  Placenta:               Posterior  P. Cord Insertion:      Previously seen  Amniotic Fluid  AFI FV:      Within normal limits  AFI Sum(cm)     %Tile       Largest Pocket(cm)  10.6             28          3.01  RUQ(cm)       RLQ(cm)       LUQ(cm)        LLQ(cm)  2.53          2.65          2.41           3.01 ---------------------------------------------------------------------- Biophysical Evaluation  Amniotic F.V:   Pocket => 2 cm             F. Tone:        Observed  F. Movement:    Observed                   Score:          8/8  F. Breathing:   Observed ---------------------------------------------------------------------- OB History  Gravidity:    3         Term:   2        Prem:   0        SAB:   0  TOP:          0       Ectopic:  0        Living: 2 ---------------------------------------------------------------------- Gestational Age  LMP:           33w 3d        Date:  04/20/23                  EDD:   01/25/24  Best:          36w 6d     Det. ByMarcella Dubs         EDD:   01/01/24                                      (06/29/23) ---------------------------------------------------------------------- Anatomy  Diaphragm:             Appears normal         Kidneys:                Appear normal  Stomach:               Appears normal, left   Bladder:                Appears normal  sided ---------------------------------------------------------------------- Cervix Uterus Adnexa  Cervix  Not visualized (advanced GA >24wks) ---------------------------------------------------------------------- Comments  Brianna Grimes is currently at 36 weeks and 6 days.  She  was seen for a BPP due to maternal obesity with a BMI of 45.  She denies any problems since her last exam and has  screened negative for gestational diabetes.  A biophysical profile performed today was 8/8.  The AFI was 10.6 cm (within normal limits).  The left renal pelvis measured 0.6 to 0.7 cm dilated.  This is  most likely a normal finding at her current gestational age.  The patient was advised to notify her pediatrician regarding  pyelectasis that has been noted on her prenatal ultrasound  exams.  Her  pediatrician will order additional imaging studies  of the baby's kidneys after birth.  She will return in 1 week for another BPP.  Due to maternal obesity, delivery should be considered at  around 39 weeks.  The patient stated that all of her questions were answered  today.  A total of 20 minutes was spent counseling and coordinating  the care for this patient.  Greater than 50% of the time was  spent in direct face-to-face contact. ----------------------------------------------------------------------                   Ma Rings, MD Electronically Signed Final Report   12/10/2023 04:12 pm ----------------------------------------------------------------------    Assessment:  Brianna Grimes is a 28 y.o. L2G4010 female at [redacted]w[redacted]d with IOL for elective at term.   Plan:  1. Admit to Labor & Delivery; consents reviewed and obtained - Dr. Dalbert Garnet notified of labor admission  2. Fetal Well being  - Fetal Tracing: Category I tracing - Group B Streptococcus ppx indicated: PCN indicated, start PCN now d/t advanced dilation and multiporous status - Presentation: vertex confirmed by SVE   3. Routine OB: - Prenatal labs reviewed, as above - Rh positive - CBC, T&S, RPR on admit - Clear fluids, IVF  4. Induction of Labor -  Contractions rare, external toco in place -  Pelvis proven to 4310g -  Plan for induction with pitocin, AROM -  Plan for continuous fetal monitoring  -  Maternal pain control as desired; requesting regional anesthesia - Anticipate vaginal delivery  5. Post Partum Planning: - Infant feeding: breast and bottle - Contraception: interval BTL - Tdap: received AP - Flu: received AP - RSV: declined  Janyce Llanos, CNM 12/27/23 9:59 AM

## 2023-12-27 NOTE — Progress Notes (Signed)
 Labor Progress Note  Brianna Grimes is a 28 y.o. G3P2002 at [redacted]w[redacted]d by ultrasound admitted for induction of labor due to BMI >40.  Subjective: she is comfortable after her epidural, feels occasional pressure  Objective: BP (!) 115/56   Pulse 79   Temp 98.2 F (36.8 C) (Oral)   Resp 18   Ht 5' 8.5" (1.74 m)   Wt (!) 145.2 kg   LMP 09/19/2022   SpO2 100%   BMI 47.95 kg/m  Notable VS details: reviewed  Fetal Assessment: FHT:  FHR: 130 bpm, variability: moderate,  accelerations:  Present,  decelerations:  Present recurrent early decelerations Category/reactivity:  Category I UC:   regular, every 2-3 minutes SVE:    Dilation: 9cm  Effacement: 100%  Station:  +1  Consistency: soft  Position: anterior  Membrane status:AROM @ 1350 Amniotic color: clear  Labs: Lab Results  Component Value Date   WBC 8.2 12/27/2023   HGB 11.3 (L) 12/27/2023   HCT 35.2 (L) 12/27/2023   MCV 84.0 12/27/2023   PLT 179 12/27/2023    Assessment / Plan: Induction of labor due to BMI > 40,  progressing well on pitocin  Labor:  Good labor progress , pitocin @ 81mu/min Preeclampsia:   BP 115/56 Fetal Wellbeing:  Category I Pain Control:  Epidural, very comfortable I/D:   GBS positive, PCN treated adequately Anticipated MOD:  NSVD  Janyce Llanos, CNM 12/27/2023, 4:33 PM

## 2023-12-28 LAB — RPR: RPR Ser Ql: NONREACTIVE

## 2023-12-28 LAB — CBC
HCT: 33 % — ABNORMAL LOW (ref 36.0–46.0)
Hemoglobin: 10.7 g/dL — ABNORMAL LOW (ref 12.0–15.0)
MCH: 27.4 pg (ref 26.0–34.0)
MCHC: 32.4 g/dL (ref 30.0–36.0)
MCV: 84.4 fL (ref 80.0–100.0)
Platelets: 193 10*3/uL (ref 150–400)
RBC: 3.91 MIL/uL (ref 3.87–5.11)
RDW: 16.7 % — ABNORMAL HIGH (ref 11.5–15.5)
WBC: 10.7 10*3/uL — ABNORMAL HIGH (ref 4.0–10.5)
nRBC: 0 % (ref 0.0–0.2)

## 2023-12-28 MED ORDER — FERROUS SULFATE 325 (65 FE) MG PO TABS
325.0000 mg | ORAL_TABLET | ORAL | Status: DC
  Administered 2023-12-28: 325 mg via ORAL
  Filled 2023-12-28: qty 1

## 2023-12-28 MED ORDER — IBUPROFEN 600 MG PO TABS: 600.0000 mg | ORAL_TABLET | Freq: Four times a day (QID) | ORAL | 0 refills | Status: AC | PRN

## 2023-12-28 MED ORDER — SENNOSIDES-DOCUSATE SODIUM 8.6-50 MG PO TABS: 2.0000 | ORAL_TABLET | Freq: Every day | ORAL | Status: AC

## 2023-12-28 MED ORDER — BENZOCAINE-MENTHOL 20-0.5 % EX AERO
1.0000 | INHALATION_SPRAY | CUTANEOUS | Status: AC | PRN
Start: 2023-12-28 — End: ?

## 2023-12-28 MED ORDER — COCONUT OIL OIL: 1.0000 | TOPICAL_OIL | Status: AC | PRN

## 2023-12-28 MED ORDER — FERROUS SULFATE 325 (65 FE) MG PO TABS: 325.0000 mg | ORAL_TABLET | ORAL | 0 refills | Status: AC

## 2023-12-28 MED ORDER — DIBUCAINE (PERIANAL) 1 % EX OINT: 1.0000 | TOPICAL_OINTMENT | CUTANEOUS | Status: AC | PRN

## 2023-12-28 MED ORDER — WITCH HAZEL-GLYCERIN EX PADS
1.0000 | MEDICATED_PAD | CUTANEOUS | Status: AC | PRN
Start: 2023-12-28 — End: ?

## 2023-12-28 NOTE — Lactation Note (Signed)
 This note was copied from a baby's chart. Lactation Consultation Note  Patient Name: Boy Danese Dorsainvil [id number] Date: 12/28/2023 Age:26 hours Reason for consult: Follow-up assessment;Term;Maternal discharge   Maternal Data Has patient been taught Hand Expression?: Yes Does the patient have breastfeeding experience prior to this delivery?: Yes How long did the patient breastfeed?: 6 mths  Feeding Mother's Current Feeding Choice: Breast Milk and Formula Nipple Type: Slow - flow Mom having more trouble latching baby to right breast, her last child had problems too, baby had just fed 30 min on left breast, still rooting, offered right breast in cradle hold per mom, baby eager and latched well, just needed gentle chin pressure to widen latch and shaping of breast to get deeper latch.  LATCH Score Latch: Grasps breast easily, tongue down, lips flanged, rhythmical sucking.  Audible Swallowing: Spontaneous and intermittent  Type of Nipple: Everted at rest and after stimulation  Comfort (Breast/Nipple): Filling, red/small blisters or bruises, mild/mod discomfort (mild tenderness)  Hold (Positioning): No assistance needed to correctly position infant at breast.  LATCH Score: 9   Lactation Tools Discussed/Used  LC name updated on white board   Interventions Interventions: Assisted with latch;Support pillows;Coconut oil;Education (assisted with latch on right breast)  Discharge Discharge Education: Engorgement and breast care Pump: Personal WIC Program: Yes  Consult Status Consult Status: PRN    Dyann Kief 12/28/2023, 2:04 PM

## 2023-12-28 NOTE — Anesthesia Postprocedure Evaluation (Signed)
 Anesthesia Post Note  Patient: Brianna Grimes  Procedure(s) Performed: AN AD HOC LABOR EPIDURAL  Patient location during evaluation: Mother Baby Anesthesia Type: Epidural Level of consciousness: awake and alert Pain management: pain level controlled Vital Signs Assessment: post-procedure vital signs reviewed and stable Respiratory status: spontaneous breathing, nonlabored ventilation and respiratory function stable Cardiovascular status: stable Postop Assessment: no headache, no backache and epidural receding Anesthetic complications: no   No notable events documented.   Last Vitals:  Vitals:   12/27/23 2222 12/28/23 0410  BP: (!) 114/58 139/89  Pulse: 82 88  Resp: 18 18  Temp: 36.9 C 36.4 C  SpO2: 100% 98%    Last Pain:  Vitals:   12/28/23 0728  TempSrc:   PainSc: 3                  Berkeley Veldman Lawerance Cruel

## 2023-12-28 NOTE — Progress Notes (Addendum)
 Patient discharged home with infant. Discharge instructions and prescriptions given and reviewed with patient. Patient verbalized understanding. Escorted out by staff.

## 2023-12-28 NOTE — Discharge Instructions (Signed)

## 2023-12-28 NOTE — Lactation Note (Addendum)
 This note was copied from a baby's chart. Lactation Consultation Note  Patient Name: Boy Chayse Gracey [id number] Date: 12/28/2023 Age:26 hours Reason for consult: Follow-up assessment;Term;Maternal discharge   Maternal Data Has patient been taught Hand Expression?: Yes Does the patient have breastfeeding experience prior to this delivery?: Yes How long did the patient breastfeed?: 6 mths  Feeding Mother's Current Feeding Choice: Breast Milk and Formula Nipple Type: Slow - flow Assisted mom with latching baby to right breast in cradle hold, states baby latches and nurses well on left, baby eager and latched well after hand expression, mom shown how to widen latch by chin pressure and shaping breast to get deep latch LATCH Score Latch: Grasps breast easily, tongue down, lips flanged, rhythmical sucking.  Audible Swallowing: Spontaneous and intermittent  Type of Nipple: Everted at rest and after stimulation  Comfort (Breast/Nipple): Filling, red/small blisters or bruises, mild/mod discomfort (mild tenderness)  Hold (Positioning): No assistance needed to correctly position infant at breast.  LATCH Score: 9   Lactation Tools Discussed/Used  LC name updated on white board   Interventions Interventions: Assisted with latch;Support pillows;Coconut oil;Education (assisted with latch on right breast)  Discharge Discharge Education: Engorgement and breast care Pump: Personal WIC Program: Yes  Consult Status Consult Status: PRN    Dyann Kief 12/28/2023, 2:08 PM

## 2023-12-28 NOTE — Discharge Summary (Signed)
 Postpartum Discharge Summary  Patient Name: Brianna Grimes DOB: 11/12/1995 MRN: [id number]  Date of admission: 12/27/2023 Delivery date:12/27/2023 Delivering provider: Donato Schultz RENEE Date of discharge: 12/28/2023  Primary OB: [facility] WUJ:[id number] last menstrual period was 09/19/2022. EDC Estimated Date of Delivery: 01/01/24 Gestational Age at Delivery: [redacted]w[redacted]d   Admitting diagnosis: Encounter for induction of labor [Z34.90] Intrauterine pregnancy: [redacted]w[redacted]d     Secondary diagnosis:   Principal Problem:   NSVD (normal spontaneous vaginal delivery) Active Problems:   Obesity affecting pregnancy   Encounter for induction of labor   Anxiety during pregnancy   History of postpartum depression, currently pregnant in third trimester   Elevated glucose tolerance test   Positive GBS test   Rubella nonimmune status, delivered, current hospitalization   History of macrosomia in infant in prior pregnancy, currently pregnant  Discharge Diagnosis: Term Pregnancy Delivered                                                Post partum procedures: None  Induction:: AROM and Pitocin Complications: None Delivery Type: spontaneous vaginal delivery Anesthesia: epidural anesthesia Placenta: spontaneous To Pathology: No  Laceration: none Episiotomy: none  Prenatal Labs:  Blood type/Rh A positive  Antibody screen neg  Rubella Non-Immune  Varicella Non-Immune  RPR NR  HBsAg Neg  HIV NR  GC neg  Chlamydia neg  Genetic screening negative  1 hour GTT 143  3 hour GTT 97, 185, 102, 114   GBS Positive    Hospital course: Induction of Labor With Vaginal Delivery   28 y.o. yo [phone] at [redacted]w[redacted]d was admitted to the hospital 12/27/2023 for induction of labor.  Indication for induction:  obesity .  Patient had an labor course without complication. She progressed to 10/100/+2 and pushed twice, delivering viable female infant over intact perineum. Membrane Rupture Time/Date: 1:50  PM,12/27/2023  Delivery Method:Vaginal, Spontaneous Operative Delivery:N/A Episiotomy: None Lacerations:  None Details of delivery can be found in separate delivery note.  Patient had a uncomplicated postpartum course. Patient is discharged home 12/28/23.  Newborn Data: "Marny Lowenstein" Birth date:12/27/2023 Birth time:5:33 PM Gender:Female Living status:Living Apgars:9 ,9  Weight:3420 g  Magnesium Sulfate received: No BMZ received: No Rhophylac:No MMR: offered prior to discharge  Varivax vaccine given: offered prior to discharge T-DaP:Given prenatally Flu: Given prenatally  Transfusion:No  Physical exam  Vitals:   12/27/23 2222 12/28/23 0410 12/28/23 0813 12/28/23 1617  BP: (!) 114/58 139/89 128/88 (!) 151/86  Pulse: 82 88 93 99  Resp: 18 18 20 18   Temp: 98.4 F (36.9 C) 97.6 F (36.4 C) 97.9 F (36.6 C) 98 F (36.7 C)  TempSrc: Oral Oral    SpO2: 100% 98% 100% 100%  Weight:      Height:       General: alert, cooperative, and no distress Lochia: appropriate Uterine Fundus: firm Perineum: minimal edema/intact DVT Evaluation: No evidence of DVT seen on physical exam.  Labs: Lab Results  Component Value Date   WBC 10.7 (H) 12/28/2023   HGB 10.7 (L) 12/28/2023   HCT 33.0 (L) 12/28/2023   MCV 84.4 12/28/2023   PLT 193 12/28/2023      Latest Ref Rng & Units 10/19/2023    2:44 PM  CMP  Glucose 70 - 99 mg/dL 87   BUN 6 - 20 mg/dL 6   Creatinine 0.86 - 5.78 mg/dL  0.39   Sodium 135 - 145 mmol/L 135   Potassium 3.5 - 5.1 mmol/L 3.4   Chloride 98 - 111 mmol/L 106   CO2 22 - 32 mmol/L 19   Calcium 8.9 - 10.3 mg/dL 7.9   Total Protein 6.5 - 8.1 g/dL 6.6   Total Bilirubin <1.6 mg/dL 0.3   Alkaline Phos 38 - 126 U/L 64   AST 15 - 41 U/L 15   ALT 0 - 44 U/L 14    Edinburgh Score:    12/27/2023    8:30 PM  Edinburgh Postnatal Depression Scale Screening Tool  I have been able to laugh and see the funny side of things. --    Risk assessment for postpartum VTE and  prophylactic treatment: Very high risk factors: None High risk factors: BMI 40-50 kg/m2 Moderate risk factors: None  Postpartum VTE prophylaxis with LMWH not indicated  After visit meds:  Allergies as of 12/28/2023   No Known Allergies      Medication List     TAKE these medications    acetaminophen 325 MG tablet Commonly known as: Tylenol Take 2 tablets (650 mg total) by mouth every 4 (four) hours as needed for mild pain, moderate pain or headache.   benzocaine-Menthol 20-0.5 % Aero Commonly known as: DERMOPLAST Apply 1 Application topically as needed for irritation (perineal discomfort).   coconut oil Oil Apply 1 Application topically as needed.   dibucaine 1 % Oint Commonly known as: NUPERCAINAL Place 1 Application rectally as needed for hemorrhoids.   ferrous sulfate 325 (65 FE) MG tablet Take 1 tablet (325 mg total) by mouth every Monday, Wednesday, and Friday. Start taking on: December 31, 2023   ibuprofen 600 MG tablet Commonly known as: ADVIL Take 1 tablet (600 mg total) by mouth every 6 (six) hours as needed.   multivitamin-prenatal 27-0.8 MG Tabs tablet Take 1 tablet by mouth daily at 12 noon.   senna-docusate 8.6-50 MG tablet Commonly known as: Senokot-S Take 2 tablets by mouth daily. Start taking on: December 29, 2023   witch hazel-glycerin pad Commonly known as: TUCKS Apply 1 Application topically as needed for hemorrhoids.       Discharge home in stable condition Infant Feeding: Bottle and Breast Infant Disposition:home with mother Discharge instruction: per After Visit Summary and Postpartum booklet. Activity: Advance as tolerated. Pelvic rest for 6 weeks.  Diet: routine diet Anticipated Birth Control: Plans Interval BTL Postpartum Appointment:6 weeks Additional Postpartum F/U: Postpartum Depression checkup Future Appointments:No future appointments. Follow up Visit:  Follow-up Information     Janyce Llanos, CNM Follow up in 2  week(s).   Specialty: Certified Nurse Midwife Why: 2wk mood check Contact information: [address] [place] [address] [phone]         Janyce Llanos, CNM Follow up in 6 week(s).   Specialty: Certified Nurse Midwife Why: 6wk postpartum Contact information: [address] [place] [address] [phone]         Christeen Douglas, MD Follow up in 4 week(s).   Specialty: Obstetrics and Gynecology Why: BTL consult Contact information: [address] [place] [address] [phone]                 Plan:  Sherisse Fullilove was discharged to home in good condition.  - Patient advised to take ferrous sulfate 325 mg PO MWF.   - Asymptomatic - Hgb: 10.7 (12/28/2023) - Plan to recheck Hgb at 6wk pp visit.    -  Follow-up appointment as directed.   SignedRoney Jaffe, CNM 12/28/2023 5:56 PM

## 2024-10-05 ENCOUNTER — Emergency Department

## 2024-10-05 ENCOUNTER — Emergency Department
Admission: EM | Admit: 2024-10-05 | Discharge: 2024-10-05 | Disposition: A | Discharge status: Home / Self Care | Attending: Emergency Medicine | Admitting: Emergency Medicine

## 2024-10-05 ENCOUNTER — Other Ambulatory Visit: Payer: Self-pay

## 2024-10-05 DIAGNOSIS — J020 Streptococcal pharyngitis: Secondary | ICD-10-CM | POA: Insufficient documentation

## 2024-10-05 LAB — BASIC METABOLIC PANEL WITH GFR
Anion gap: 13 (ref 5–15)
BUN: 13 mg/dL (ref 6–20)
CO2: 25 mmol/L (ref 22–32)
Calcium: 9 mg/dL (ref 8.9–10.3)
Chloride: 101 mmol/L (ref 98–111)
Creatinine, Ser: 0.5 mg/dL (ref 0.44–1.00)
GFR, Estimated: >60 mL/min (ref >60)
Glucose, Bld: 101 mg/dL — ABNORMAL HIGH (ref 70–99)
Potassium: 3.4 mmol/L — ABNORMAL LOW (ref 3.5–5.1)
Sodium: 139 mmol/L (ref 135–145)

## 2024-10-05 LAB — RESP PANEL BY RT-PCR (RSV, FLU A&B, COVID)  RVPGX2
Influenza A by PCR: NEGATIVE
Influenza B by PCR: NEGATIVE
Resp Syncytial Virus by PCR: NEGATIVE
SARS Coronavirus 2 by RT PCR: NEGATIVE

## 2024-10-05 LAB — CBC WITH DIFFERENTIAL/PLATELET
Abs Immature Granulocytes: 0.04 K/uL (ref 0.00–0.07)
Basophils Absolute: 0 K/uL (ref 0.0–0.1)
Basophils Relative: 0 %
Eosinophils Absolute: 0.1 K/uL (ref 0.0–0.5)
Eosinophils Relative: 1 %
HCT: 38.6 % (ref 36.0–46.0)
Hemoglobin: 12.1 g/dL (ref 12.0–15.0)
Immature Granulocytes: 0 %
Lymphocytes Relative: 16 %
Lymphs Abs: 1.8 K/uL (ref 0.7–4.0)
MCH: 26.1 pg (ref 26.0–34.0)
MCHC: 31.3 g/dL (ref 30.0–36.0)
MCV: 83.4 fL (ref 80.0–100.0)
Monocytes Absolute: 0.7 K/uL (ref 0.1–1.0)
Monocytes Relative: 6 %
Neutro Abs: 8.4 K/uL — ABNORMAL HIGH (ref 1.7–7.7)
Neutrophils Relative %: 77 %
Platelets: 273 K/uL (ref 150–400)
RBC: 4.63 MIL/uL (ref 3.87–5.11)
RDW: 15.3 % (ref 11.5–15.5)
WBC: 11 K/uL — ABNORMAL HIGH (ref 4.0–10.5)
nRBC: 0 % (ref 0.0–0.2)

## 2024-10-05 LAB — GROUP A STREP BY PCR: Group A Strep by PCR: DETECTED — AB

## 2024-10-05 MED ORDER — AMOXICILLIN-POT CLAVULANATE 875-125 MG PO TABS: 1.0000 | ORAL_TABLET | Freq: Two times a day (BID) | ORAL | 0 refills | Status: AC

## 2024-10-05 MED ORDER — LACTATED RINGERS IV BOLUS
1000.0000 mL | Freq: Once | INTRAVENOUS | Status: AC
  Administered 2024-10-05: 1000 mL via INTRAVENOUS

## 2024-10-05 MED ORDER — IOHEXOL 300 MG/ML  SOLN
75.0000 mL | Freq: Once | INTRAMUSCULAR | Status: AC | PRN
  Administered 2024-10-05: 75 mL via INTRAVENOUS

## 2024-10-05 MED ORDER — LIDOCAINE VISCOUS HCL 2 % MT SOLN: 15.0000 mL | Freq: Four times a day (QID) | OROMUCOSAL | 0 refills | Status: AC | PRN

## 2024-10-05 MED ORDER — SODIUM CHLORIDE 0.9 % IV SOLN
3.0000 g | Freq: Once | INTRAVENOUS | Status: AC
  Administered 2024-10-05: 3 g via INTRAVENOUS
  Filled 2024-10-05: qty 8

## 2024-10-05 MED ORDER — KETOROLAC TROMETHAMINE 30 MG/ML IJ SOLN
15.0000 mg | Freq: Once | INTRAMUSCULAR | Status: AC
  Administered 2024-10-05: 15 mg via INTRAVENOUS
  Filled 2024-10-05: qty 1

## 2024-10-05 MED ORDER — DEXAMETHASONE SOD PHOSPHATE PF 10 MG/ML IJ SOLN
10.0000 mg | Freq: Once | INTRAMUSCULAR | Status: AC
  Administered 2024-10-05: 10 mg via INTRAVENOUS

## 2024-10-05 NOTE — ED Provider Notes (Signed)
 [facility] Provider Note    Event Date/Time   First MD Initiated Contact with Patient 10/05/24 0038     (approximate)   History   Sore Throat   HPI  Brianna Grimes is a 28 y.o. female who presents to the ED for evaluation of Sore Throat   Patient presents alongside her cousin for evaluation of 1 week of worsening URI symptoms, sore throat, odynophagia, muffled voice   Physical Exam   Triage Vital Signs: ED Triage Vitals  Encounter Vitals Group     BP 10/05/24 0033 126/86     Girls Systolic BP Percentile --      Girls Diastolic BP Percentile --      Boys Systolic BP Percentile --      Boys Diastolic BP Percentile --      Pulse Rate 10/05/24 0030 (!) 146     Resp 10/05/24 0030 (!) 22     Temp 10/05/24 0030 100.1 F (37.8 C)     Temp src --      SpO2 10/05/24 0030 98 %     Weight --      Height --      Head Circumference --      Peak Flow --      Pain Score --      Pain Loc --      Pain Education --      Exclude from Growth Chart --     Most recent vital signs: Vitals:   10/05/24 0033 10/05/24 0416  BP: 126/86 (!) 128/96  Pulse:  96  Resp:  16  Temp:  99 F (37.2 C)  SpO2:  98%    General: Awake, no distress.  Morbidly obese, looks dry.  Able to phonate but somewhat muffled voice, difficulty swallowing due to pain CV:  Good peripheral perfusion.  Resp:  Normal effort.  Abd:  No distention.  MSK:  No deformity noted.  Neuro:  No focal deficits appreciated. Other:  Posterior pharynx is erythematous, bilateral tonsillar swelling right greater than left concerning for PTA on the right side   ED Results / Procedures / Treatments   Labs (all labs ordered are listed, but only abnormal results are displayed) Labs Reviewed  GROUP A STREP BY PCR - Abnormal; Notable for the following components:      Result Value   Group A Strep by PCR DETECTED (*)    All other components within normal limits  CBC WITH DIFFERENTIAL/PLATELET  - Abnormal; Notable for the following components:   WBC 11.0 (*)    Neutro Abs 8.4 (*)    All other components within normal limits  BASIC METABOLIC PANEL WITH GFR - Abnormal; Notable for the following components:   Potassium 3.4 (*)    Glucose, Bld 101 (*)    All other components within normal limits  RESP PANEL BY RT-PCR (RSV, FLU A&B, COVID)  [id number]    EKG   RADIOLOGY CT soft tissue neck interpreted by me without PTA  Official radiology report(s): CT Soft Tissue Neck W Contrast Result Date: 10/05/2024 EXAM: CT NECK WITH CONTRAST 10/05/2024 03:06:18 AM TECHNIQUE: CT of the neck was performed with the administration of 75 mL of iohexol  (OMNIPAQUE ) 300 MG/ML solution. Multiplanar reformatted images are provided for review. Automated exposure control, iterative reconstruction, and/or weight based adjustment of the mA/kV was utilized to reduce the radiation dose to as low as reasonably achievable. COMPARISON: None available. CLINICAL HISTORY: tonsillitis, suspect right PTA FINDINGS:  AERODIGESTIVE TRACT: Enlarged adenoids and palatine tonsils. No peritonsillar or retropharyngeal abscess. SALIVARY GLANDS: The parotid and submandibular glands are unremarkable. THYROID : Unremarkable. LYMPH NODES: Numerous reactive bilateral cervical lymph nodes. SOFT TISSUES: No mass or fluid collection. BRAIN, ORBITS, SINUSES AND MASTOIDS: No acute abnormality. LUNGS AND MEDIASTINUM: There is a 9 mm ground glass nodule in the right upper lobe. According to the Fleischner Society pulmonary nodule recommendations, the finding is consistent with a single ground glass nodule >=6 mm and the recommendation is CT at 6-12 months, then if persistent, CT every 2 years until 5 years. (series 6 image 161) BONES: No focal bone abnormality. IMPRESSION: 1. Enlarged adenoids and palatine tonsils, consistent with tonsillitis. No peritonsillar or retropharyngeal abscess. 2. Numerous reactive bilateral cervical lymph nodes. 3. 9 mm  ground glass nodule in the right upper lobe, likely inflammatory/infectious. Recommend CT at 6-12 months to confirm persistence, then if persistent, CT every 2 years until 5 years, per Fleischner Society Guidelines. Electronically signed by: Franky Stanford MD 10/05/2024 03:13 AM EST RP Workstation: [id number]    PROCEDURES and INTERVENTIONS:  Procedures  Medications  ketorolac  (TORADOL ) 30 MG/ML injection 15 mg (15 mg Intravenous Given 10/05/24 0105)  dexamethasone  (DECADRON ) injection 10 mg (10 mg Intravenous Given 10/05/24 0102)  Ampicillin -Sulbactam (UNASYN ) 3 g in sodium chloride  0.9 % 100 mL IVPB (0 g Intravenous Stopped 10/05/24 0136)  lactated ringers  bolus 1,000 mL (0 mLs Intravenous Stopped 10/05/24 0417)  iohexol  (OMNIPAQUE ) 300 MG/ML solution 75 mL (75 mLs Intravenous Contrast Given 10/05/24 0255)     IMPRESSION / MDM / ASSESSMENT AND PLAN / ED COURSE  I reviewed the triage vital signs and the nursing notes.  Differential diagnosis includes, but is not limited to, viral illness, strep throat, peritonsillar abscess, upper airway obstruction  {Patient presents with symptoms of an acute illness or injury that is potentially life-threatening.  Patient presents with signs of strep throat without evidence of PTA or more complicating features, suitable for outpatient management.  Mild leukocytosis, normal metabolic panel, negative viral swabs and positive strep test.  CT without PTA.  Significantly improving symptoms with steroids, anti-inflammatories and starting antibiotics.  Suitable for outpatient management.  Clinical Course as of 10/05/24 9288  [place] Oct 05, 2024  0334 Reassessed, feeling much better.  Looks well, hungry and requesting discharge.  We discussed reassuring CT scan, positive strep test, antibiotics, expectant management and ED return precautions [DS]    Clinical Course User Index [DS] Claudene Rover, MD     FINAL CLINICAL IMPRESSION(S) / ED DIAGNOSES   Final  diagnoses:  Strep pharyngitis     Rx / DC Orders   ED Discharge Orders          Ordered    amoxicillin -clavulanate (AUGMENTIN ) 875-125 MG tablet  2 times daily        10/05/24 0335    lidocaine  (XYLOCAINE ) 2 % solution  Every 6 hours PRN        10/05/24 0335             Note:  This document was prepared using Dragon voice recognition software and may include unintentional dictation errors.   Claudene Rover, MD 10/05/24 [phone]

## 2024-10-05 NOTE — ED Triage Notes (Signed)
 Reports tonsillar swelling for about a week. Swelling worse today, unsure of fevers.reports hard time breathing. (Significant tonsillar swelling noted)

## 2024-10-05 NOTE — Discharge Instructions (Addendum)
 Bad strep throat but no complicating features like abscess behind the tonsils.  Augmentin  antibiotics twice daily for 1 more week  Lidocaine  solution as needed to help with pain in the back of the throat  Please take Tylenol  and ibuprofen /Advil  for your pain.  It is safe to take them together, or to alternate them every few hours.  Take up to 1000mg  of Tylenol  at a time, up to 4 times per day.  Do not take more than 4000 mg of Tylenol  in 24 hours.  For ibuprofen , take 400-600 mg, 3 - 4 times per day.  Return to ED with any worsening symptoms despite treatment
